# Patient Record
Sex: Female | Born: 1985 | Race: Black or African American | Hispanic: No | Marital: Single | Smoking: Current every day smoker
Health system: Southern US, Community
[De-identification: ages and names within clinical notes are randomized; demographics above are authoritative.]

## PROBLEM LIST (undated history)

## (undated) DIAGNOSIS — G709 Myoneural disorder, unspecified: Secondary | ICD-10-CM

## (undated) DIAGNOSIS — F419 Anxiety disorder, unspecified: Secondary | ICD-10-CM

## (undated) DIAGNOSIS — F1920 Other psychoactive substance dependence, uncomplicated: Secondary | ICD-10-CM

## (undated) DIAGNOSIS — T7840XA Allergy, unspecified, initial encounter: Secondary | ICD-10-CM

## (undated) DIAGNOSIS — M329 Systemic lupus erythematosus, unspecified: Secondary | ICD-10-CM

## (undated) DIAGNOSIS — M1990 Unspecified osteoarthritis, unspecified site: Secondary | ICD-10-CM

## (undated) DIAGNOSIS — F32A Depression, unspecified: Secondary | ICD-10-CM

## (undated) DIAGNOSIS — K219 Gastro-esophageal reflux disease without esophagitis: Secondary | ICD-10-CM

## (undated) HISTORY — DX: Allergy, unspecified, initial encounter: T78.40XA

## (undated) HISTORY — DX: Unspecified osteoarthritis, unspecified site: M19.90

## (undated) HISTORY — PX: ABDOMINAL HYSTERECTOMY: SHX81

## (undated) HISTORY — DX: Myoneural disorder, unspecified: G70.9

## (undated) HISTORY — DX: Systemic lupus erythematosus, unspecified: M32.9

## (undated) HISTORY — DX: Gastro-esophageal reflux disease without esophagitis: K21.9

---

## 2007-09-01 HISTORY — PX: LIPOMA EXCISION: SHX5283

## 2011-02-14 ENCOUNTER — Inpatient Hospital Stay (INDEPENDENT_AMBULATORY_CARE_PROVIDER_SITE_OTHER)
Admission: RE | Admit: 2011-02-14 | Discharge: 2011-02-14 | Disposition: A | Discharge status: Home / Self Care | Payer: Self-pay | Source: Ambulatory Visit | Attending: Emergency Medicine | Admitting: Emergency Medicine

## 2011-02-14 DIAGNOSIS — N390 Urinary tract infection, site not specified: Secondary | ICD-10-CM

## 2011-02-14 LAB — POCT URINALYSIS DIP (DEVICE)
Bilirubin Urine: NEGATIVE
Ketones, ur: NEGATIVE mg/dL
Protein, ur: 30 mg/dL — AB
Specific Gravity, Urine: 1.02 (ref 1.005–1.030)
pH: 7.5 (ref 5.0–8.0)

## 2011-02-14 LAB — POCT PREGNANCY, URINE: Preg Test, Ur: NEGATIVE

## 2011-02-24 ENCOUNTER — Inpatient Hospital Stay (INDEPENDENT_AMBULATORY_CARE_PROVIDER_SITE_OTHER)
Admission: RE | Admit: 2011-02-24 | Discharge: 2011-02-24 | Disposition: A | Discharge status: Home / Self Care | Payer: Self-pay | Source: Ambulatory Visit | Attending: Emergency Medicine | Admitting: Emergency Medicine

## 2011-02-24 DIAGNOSIS — N946 Dysmenorrhea, unspecified: Secondary | ICD-10-CM

## 2011-02-25 LAB — POCT URINALYSIS DIP (DEVICE)
Glucose, UA: NEGATIVE mg/dL
Ketones, ur: 15 mg/dL — AB
Nitrite: POSITIVE — AB
pH: 6 (ref 5.0–8.0)

## 2011-03-11 ENCOUNTER — Emergency Department (HOSPITAL_COMMUNITY)
Admission: EM | Admit: 2011-03-11 | Discharge: 2011-03-11 | Disposition: A | Discharge status: Home / Self Care | Payer: Self-pay | Attending: Emergency Medicine | Admitting: Emergency Medicine

## 2011-03-11 DIAGNOSIS — M549 Dorsalgia, unspecified: Secondary | ICD-10-CM | POA: Insufficient documentation

## 2011-03-11 DIAGNOSIS — A599 Trichomoniasis, unspecified: Secondary | ICD-10-CM | POA: Insufficient documentation

## 2011-03-11 LAB — URINALYSIS, ROUTINE W REFLEX MICROSCOPIC
Nitrite: NEGATIVE
Protein, ur: NEGATIVE mg/dL
Specific Gravity, Urine: 1.027 (ref 1.005–1.030)
Urobilinogen, UA: 0.2 mg/dL (ref 0.0–1.0)

## 2011-03-11 LAB — URINE MICROSCOPIC-ADD ON

## 2012-02-26 ENCOUNTER — Encounter (HOSPITAL_COMMUNITY): Payer: Self-pay | Admitting: Emergency Medicine

## 2012-02-26 ENCOUNTER — Emergency Department (HOSPITAL_COMMUNITY)
Admission: EM | Admit: 2012-02-26 | Discharge: 2012-02-27 | Disposition: A | Discharge status: Home / Self Care | Payer: Self-pay | Attending: Emergency Medicine | Admitting: Emergency Medicine

## 2012-02-26 DIAGNOSIS — F1910 Other psychoactive substance abuse, uncomplicated: Secondary | ICD-10-CM

## 2012-02-26 DIAGNOSIS — F1120 Opioid dependence, uncomplicated: Secondary | ICD-10-CM | POA: Insufficient documentation

## 2012-02-26 DIAGNOSIS — F17200 Nicotine dependence, unspecified, uncomplicated: Secondary | ICD-10-CM | POA: Insufficient documentation

## 2012-02-26 HISTORY — DX: Other psychoactive substance dependence, uncomplicated: F19.20

## 2012-02-26 LAB — CBC
HCT: 40.4 % (ref 36.0–46.0)
MCH: 31.2 pg (ref 26.0–34.0)
MCHC: 35.9 g/dL (ref 30.0–36.0)
MCV: 86.9 fL (ref 78.0–100.0)
Platelets: 229 10*3/uL (ref 150–400)
RDW: 12.6 % (ref 11.5–15.5)
WBC: 11.9 10*3/uL — ABNORMAL HIGH (ref 4.0–10.5)

## 2012-02-26 LAB — RAPID URINE DRUG SCREEN, HOSP PERFORMED
Amphetamines: NOT DETECTED
Cocaine: NOT DETECTED
Opiates: NOT DETECTED

## 2012-02-26 LAB — COMPREHENSIVE METABOLIC PANEL
AST: 22 U/L (ref 0–37)
Albumin: 3.7 g/dL (ref 3.5–5.2)
BUN: 10 mg/dL (ref 6–23)
Calcium: 9.5 mg/dL (ref 8.4–10.5)
Chloride: 100 mEq/L (ref 96–112)
Creatinine, Ser: 1.06 mg/dL (ref 0.50–1.10)
Total Bilirubin: 0.3 mg/dL (ref 0.3–1.2)

## 2012-02-26 LAB — ETHANOL: Alcohol, Ethyl (B): <11 mg/dL (ref 0–11)

## 2012-02-26 MED ORDER — ZOLPIDEM TARTRATE 5 MG PO TABS
5.0000 mg | ORAL_TABLET | Freq: Every evening | ORAL | Status: DC | PRN
  Administered 2012-02-27: 5 mg via ORAL
  Filled 2012-02-26: qty 1

## 2012-02-26 MED ORDER — NICOTINE 21 MG/24HR TD PT24
21.0000 mg | MEDICATED_PATCH | Freq: Every day | TRANSDERMAL | Status: DC
  Administered 2012-02-26 – 2012-02-27 (×2): 21 mg via TRANSDERMAL
  Filled 2012-02-26 (×3): qty 1

## 2012-02-26 MED ORDER — ALUM & MAG HYDROXIDE-SIMETH 200-200-20 MG/5ML PO SUSP: 30.0000 mL | ORAL | Status: DC | PRN

## 2012-02-26 MED ORDER — LORAZEPAM 1 MG PO TABS
1.0000 mg | ORAL_TABLET | Freq: Once | ORAL | Status: AC
  Administered 2012-02-26: 1 mg via ORAL
  Filled 2012-02-26: qty 1

## 2012-02-26 MED ORDER — ONDANSETRON HCL 8 MG PO TABS: 4.0000 mg | ORAL_TABLET | Freq: Three times a day (TID) | ORAL | Status: DC | PRN

## 2012-02-26 MED ORDER — ACETAMINOPHEN 325 MG PO TABS
650.0000 mg | ORAL_TABLET | ORAL | Status: DC | PRN
  Administered 2012-02-26 – 2012-02-27 (×2): 650 mg via ORAL
  Filled 2012-02-26 (×2): qty 2

## 2012-02-26 MED ORDER — IBUPROFEN 400 MG PO TABS
600.0000 mg | ORAL_TABLET | Freq: Three times a day (TID) | ORAL | Status: DC | PRN
  Administered 2012-02-27 (×2): 600 mg via ORAL
  Filled 2012-02-26: qty 1
  Filled 2012-02-26: qty 3
  Filled 2012-02-26: qty 1

## 2012-02-26 NOTE — ED Provider Notes (Signed)
History     CSN: [id number]  Arrival date & time 02/26/12  1745   First MD Initiated Contact with Patient 02/26/12 1820      Chief Complaint  Patient presents with  . Medical Clearance    (Consider location/radiation/quality/duration/timing/severity/associated sxs/prior treatment) HPI Pt presents with request for detox from opiates.  Pt states her last opiate use was approx 2 weeks ago.  She states she has been looking into rehab and was directed to come to the ED for detox.  Denies any symptoms of withdrawal- no n/v/d, no body aches.  No recent fevers or other illness.  There are no other associated systemic symptoms, there are no other alleviating or modifying factors.  She denies SI/HI ideations or thoughts Past Medical History  Diagnosis Date  . Drug dependence     History reviewed. No pertinent past surgical history.  No family history on file.  History  Substance Use Topics  . Smoking status: Current Everyday Smoker  . Smokeless tobacco: Not on file  . Alcohol Use: Yes    OB History    Grav Para Term Preterm Abortions TAB SAB Ect Mult Living                  Review of Systems ROS reviewed and all otherwise negative except for mentioned in HPI  Allergies  Penicillins  Home Medications  No current outpatient prescriptions on file.  BP 135/75  Pulse 92  Temp 98.4 F (36.9 C)  Resp 20  SpO2 98%  LMP 02/13/2012 Vitals reviewed Physical Exam Physical Examination: General appearance - alert, well appearing, and in no distress Mental status - alert, oriented to person, place, and time Mouth - mucous membranes moist, pharynx normal without lesions Chest - clear to auscultation, no wheezes, rales or rhonchi, symmetric air entry Heart - normal rate, regular rhythm, normal S1, S2, no murmurs, rubs, clicks or gallops Abdomen - soft, nontender, nondistended, no masses or organomegaly Musculoskeletal - no joint tenderness, deformity or swelling Extremities -  peripheral pulses normal, no pedal edema, no clubbing or cyanosis Skin - normal coloration and turgor, no rashes Psych- normal mood and affect  ED Course  Procedures (including critical care time)  11:40 PM  Discussed with ACT team, she will evaluate in the ED, psych holding orders written  Labs Reviewed  CBC - Abnormal; Notable for the following:    WBC 11.9 (*)     All other components within normal limits  COMPREHENSIVE METABOLIC PANEL  ETHANOL  URINE RAPID DRUG SCREEN (HOSP PERFORMED)   No results found.   No diagnosis found.    MDM  Pt presenting with c/o request for detox from opiates. She is not experiencing any acute withdrawal symptoms.  She denies SI/HI.  ACT evaluation pending.  Psych holding orders written        Ethelda Chick, MD 02/27/12 2222

## 2012-02-26 NOTE — ED Notes (Signed)
The pt is c/o some leg pain.  Tylenol given

## 2012-02-26 NOTE — ED Notes (Signed)
Patient wanded and clothing and pocketbook locked in locker 11.

## 2012-02-26 NOTE — ED Notes (Signed)
Patient states she is here for detox from opiates and last uses x 1 week ago. Patient denies any pain, N/V/D/F at this time.

## 2012-02-26 NOTE — ED Notes (Signed)
Pt states she is here for detox from opiates.  Last used 1 week ago.  Denies suicidal/homicidal ideation.

## 2012-02-26 NOTE — ED Notes (Signed)
Requested urine specimen, patient unable to urinate at this time

## 2012-02-26 NOTE — ED Notes (Signed)
Patient taking a shower.

## 2012-02-27 MED ORDER — LORAZEPAM 1 MG PO TABS
1.0000 mg | ORAL_TABLET | Freq: Four times a day (QID) | ORAL | Status: DC | PRN
  Administered 2012-02-27: 1 mg via ORAL
  Filled 2012-02-27: qty 1

## 2012-02-27 MED ORDER — LORAZEPAM 1 MG PO TABS
1.0000 mg | ORAL_TABLET | Freq: Once | ORAL | Status: AC
  Administered 2012-02-27: 1 mg via ORAL
  Filled 2012-02-27: qty 1

## 2012-02-27 NOTE — ED Notes (Signed)
Voices no complaints at this time. Resting with lights out, arouses easily.

## 2012-02-27 NOTE — ED Notes (Signed)
Resting quietly  No further complaints of pain at this time.

## 2012-02-27 NOTE — ED Notes (Signed)
Resting quietly with eyes closed arouses easily, voice no C/O at this time.
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
## 2012-02-27 NOTE — ED Notes (Signed)
Resting quietly with eyes closed appears to be sleeping. NAD noted. 

## 2012-02-27 NOTE — ED Notes (Signed)
Patient up to the shower.

## 2012-02-27 NOTE — ED Notes (Signed)
Patient wanting to sleep today she did not eat her lunch tray. Ate 100% at breakfast. Denies pain or discomfort.

## 2012-02-27 NOTE — ED Notes (Signed)
The patient is AOx4 and comfortable with her discharge instructions. 

## 2012-02-27 NOTE — ED Notes (Signed)
Pt asking for another ativan.  Feeling nervous

## 2012-02-27 NOTE — ED Notes (Signed)
Pt up to the br after she was given food to eat

## 2012-02-27 NOTE — ED Notes (Signed)
Patient requested Ibuprofen for leg discomfort, although it was to early. So She then requested Tylenol 650mg s , this was given

## 2012-02-27 NOTE — Discharge Instructions (Signed)
You should follow up with rehab at the numbers provided by ACT team counselor in ED.    Return to the ED with any concerns including thoughts or feelings of suicide, or any other alarming symptoms

## 2012-02-27 NOTE — BHH Counselor (Signed)
CLINICIAN DID CALL [facility] & SPOKE WITH CHARGE NURSE CHRISTY WHO EXPRESSED THAT ANGIE WAS NOT IN ON WEEKENDS & PT WOULD NEED TO CALL ON Monday TO SPEAK WITH ANIE ABOUT GETTING INTO THE REHAB PROGRAM. PT STILL DOES NOT MEET CRITERIA FOR DETOX & WILL DISCHARGED HOME TO FOLLOW UP WITH ANGIE DURING THE WEEK.

## 2013-03-27 ENCOUNTER — Encounter (HOSPITAL_COMMUNITY): Payer: Self-pay | Admitting: Emergency Medicine

## 2013-03-27 DIAGNOSIS — F1920 Other psychoactive substance dependence, uncomplicated: Secondary | ICD-10-CM | POA: Insufficient documentation

## 2013-03-27 DIAGNOSIS — R609 Edema, unspecified: Secondary | ICD-10-CM | POA: Insufficient documentation

## 2013-03-27 DIAGNOSIS — R630 Anorexia: Secondary | ICD-10-CM | POA: Insufficient documentation

## 2013-03-27 DIAGNOSIS — F17200 Nicotine dependence, unspecified, uncomplicated: Secondary | ICD-10-CM | POA: Insufficient documentation

## 2013-03-27 DIAGNOSIS — Z3202 Encounter for pregnancy test, result negative: Secondary | ICD-10-CM | POA: Insufficient documentation

## 2013-03-27 DIAGNOSIS — Z880 Allergy status to penicillin: Secondary | ICD-10-CM | POA: Insufficient documentation

## 2013-03-27 DIAGNOSIS — Y9229 Other specified public building as the place of occurrence of the external cause: Secondary | ICD-10-CM | POA: Insufficient documentation

## 2013-03-27 DIAGNOSIS — Y9301 Activity, walking, marching and hiking: Secondary | ICD-10-CM | POA: Insufficient documentation

## 2013-03-27 DIAGNOSIS — N390 Urinary tract infection, site not specified: Secondary | ICD-10-CM | POA: Insufficient documentation

## 2013-03-27 DIAGNOSIS — W19XXXA Unspecified fall, initial encounter: Secondary | ICD-10-CM | POA: Insufficient documentation

## 2013-03-27 NOTE — ED Notes (Signed)
PT. FELL 2 DAYS AGO REPORTS MID CHEST PAIN AND RIGHT ANKLE PAIN , AMBULATORY . RESPIRATIONS UNLABORED .

## 2013-03-28 ENCOUNTER — Emergency Department (HOSPITAL_COMMUNITY)
Admit: 2013-03-28 | Discharge: 2013-03-28 | Disposition: A | Discharge status: Home / Self Care | Payer: Self-pay | Attending: Emergency Medicine | Admitting: Emergency Medicine

## 2013-03-28 ENCOUNTER — Emergency Department (HOSPITAL_COMMUNITY)
Admission: EM | Admit: 2013-03-28 | Discharge: 2013-03-28 | Disposition: A | Discharge status: Home / Self Care | Payer: Self-pay | Attending: Emergency Medicine | Admitting: Emergency Medicine

## 2013-03-28 DIAGNOSIS — N390 Urinary tract infection, site not specified: Secondary | ICD-10-CM

## 2013-03-28 DIAGNOSIS — R609 Edema, unspecified: Secondary | ICD-10-CM

## 2013-03-28 LAB — URINALYSIS, ROUTINE W REFLEX MICROSCOPIC
Nitrite: NEGATIVE
Protein, ur: NEGATIVE mg/dL
Specific Gravity, Urine: 1.024 (ref 1.005–1.030)
Urobilinogen, UA: 1 mg/dL (ref 0.0–1.0)

## 2013-03-28 LAB — CBC
MCH: 31.7 pg (ref 26.0–34.0)
MCV: 89.7 fL (ref 78.0–100.0)
Platelets: 193 10*3/uL (ref 150–400)
RBC: 4.38 MIL/uL (ref 3.87–5.11)
RDW: 12.8 % (ref 11.5–15.5)
WBC: 10.3 10*3/uL (ref 4.0–10.5)

## 2013-03-28 LAB — POCT PREGNANCY, URINE: Preg Test, Ur: NEGATIVE

## 2013-03-28 LAB — URINE MICROSCOPIC-ADD ON

## 2013-03-28 LAB — POCT I-STAT TROPONIN I

## 2013-03-28 MED ORDER — SULFAMETHOXAZOLE-TMP DS 800-160 MG PO TABS: 1.0000 | ORAL_TABLET | Freq: Two times a day (BID) | ORAL | Status: DC

## 2013-03-28 MED ORDER — SULFAMETHOXAZOLE-TMP DS 800-160 MG PO TABS
1.0000 | ORAL_TABLET | Freq: Once | ORAL | Status: AC
  Administered 2013-03-28: 1 via ORAL
  Filled 2013-03-28: qty 1

## 2013-03-28 NOTE — ED Notes (Signed)
Walking in flip-flops with fast steady gait, no limp. Alert, NAD, calm, interactive.

## 2013-03-28 NOTE — ED Notes (Signed)
Pt states that both right and left legs ache below knees; denies injury to either leg.  Pt stated that swelling began yesterday but there is reduction in swelling today/pain remains at 7 out of 10.

## 2013-03-28 NOTE — ED Provider Notes (Signed)
CSN: [id number]     Arrival date & time 03/27/13  2318 History     First MD Initiated Contact with Patient 03/28/13 0100     Chief Complaint  Patient presents with  . Fall  . Ankle Pain   (Consider location/radiation/quality/duration/timing/severity/associated sxs/prior Treatment) HPI Comments: Patient states, that 2 days ago, while walking to the grocery store from the shelter.  She "passed out."  She has no idea how long she was on the ground, but bystanders assisted her to her feet and allow her to sleep on her their couch for the day.  She denies any nausea, vomiting, fever, chills, diarrhea, URI symptoms, cough, UTI symptoms, stating her last menstrual cycle was 3, and a half weeks, ago.  Denies any injury to her fall, but she does report that her legs, below the knee have been swollen.  This has been resolving.  She, states she's been drinking plenty of fluids has a family history of high blood pressure, but no personal history of hypertension. So today she presents to find out why she passed out 2 days ago  Patient is a 27 y.o. female presenting with fall and ankle pain. The history is provided by the patient.  Fall This is a new problem. The current episode started in the past 7 days. The problem has been rapidly improving. Associated symptoms include anorexia. Pertinent negatives include no chest pain, chills, coughing, fever, headaches, nausea, rash or vomiting. Nothing aggravates the symptoms. She has tried nothing for the symptoms. The treatment provided no relief.  Ankle Pain Associated symptoms: no fever     Past Medical History  Diagnosis Date  . Drug dependence    History reviewed. No pertinent past surgical history. No family history on file. History  Substance Use Topics  . Smoking status: Current Every Day Smoker  . Smokeless tobacco: Not on file  . Alcohol Use: Yes   OB History   Grav Para Term Preterm Abortions TAB SAB Ect Mult Living                 Review  of Systems  Constitutional: Negative for fever and chills.  Respiratory: Negative for cough.   Cardiovascular: Positive for leg swelling. Negative for chest pain.  Gastrointestinal: Positive for anorexia. Negative for nausea and vomiting.  Skin: Negative for rash and wound.  Neurological: Negative for dizziness and headaches.  All other systems reviewed and are negative.    Allergies  Penicillins  Home Medications   Current Outpatient Rx  Name  Route  Sig  Dispense  Refill  . OVER THE COUNTER MEDICATION   Oral   Take 1-2 tablets by mouth every 6 (six) hours as needed (pain medication).         Marland Kitchen sulfamethoxazole-trimethoprim (BACTRIM DS) 800-160 MG per tablet   Oral   Take 1 tablet by mouth 2 (two) times daily.   5 tablet   0    BP 127/71  Pulse 74  Temp(Src) 98.3 F (36.8 C) (Oral)  Resp 16  Ht 5\' 9"  (1.753 m)  Wt 257 lb (116.574 kg)  BMI 37.93 kg/m2  SpO2 97%  LMP 02/27/2013 Physical Exam  Vitals reviewed. Constitutional: She appears well-developed and well-nourished.  Eyes: Pupils are equal, round, and reactive to light.  Cardiovascular: Normal rate and regular rhythm.   Pulmonary/Chest: Effort normal and breath sounds normal.  Musculoskeletal: She exhibits edema. She exhibits no tenderness.  Neurological: She is alert.  Skin: Skin is warm and dry.  No rash noted. No erythema.    ED Course   Procedures (including critical care time)  Labs Reviewed  URINALYSIS, ROUTINE W REFLEX MICROSCOPIC - Abnormal; Notable for the following:    APPearance CLOUDY (*)    Hgb urine dipstick LARGE (*)    Leukocytes, UA TRACE (*)    All other components within normal limits  URINE MICROSCOPIC-ADD ON - Abnormal; Notable for the following:    Squamous Epithelial / LPF FEW (*)    Bacteria, UA MANY (*)    All other components within normal limits  CBC  POCT PREGNANCY, URINE  POCT I-STAT TROPONIN I   Dg Ankle Complete Right  03/28/2013   *RADIOLOGY REPORT*  Clinical  Data: Swelling after syncopal episode yesterday.  RIGHT ANKLE - COMPLETE 3+ VIEW  Comparison: None.  Findings: Negative for fracture, malalignment, or joint narrowing.  IMPRESSION: Negative right ankle study.   Original Report Authenticated By: Tiburcio Pea   1. UTI (lower urinary tract infection)   2. Peripheral edema    ED ECG REPORT   Date: 03/28/2013  EKG Time: 2:25 AM  Rate: 79  Rhythm: normal sinus rhythm,  there are no previous tracings available for comparison  Axis: normal  Intervals:none  ST&T Change: none  Narrative Interpretation: normal           MDM  Will obtain CBC i-STAT urine, and EKG Will treat UTI recommend diet changes   Arman Filter, NP 03/28/13 1610

## 2013-03-29 NOTE — ED Provider Notes (Signed)
Medical screening examination/treatment/procedure(s) were performed by non-physician practitioner and as supervising physician I was immediately available for consultation/collaboration.  Tien Spooner, MD 03/29/13 0442 

## 2016-07-08 ENCOUNTER — Ambulatory Visit
Admission: RE | Admit: 2016-07-08 | Discharge: 2016-07-08 | Disposition: A | Discharge status: Home / Self Care | Payer: No Typology Code available for payment source | Source: Ambulatory Visit | Attending: Internal Medicine | Admitting: Internal Medicine

## 2016-07-08 ENCOUNTER — Other Ambulatory Visit: Payer: Self-pay | Admitting: Internal Medicine

## 2016-07-08 DIAGNOSIS — M549 Dorsalgia, unspecified: Secondary | ICD-10-CM

## 2017-04-03 IMAGING — CR DG LUMBAR SPINE COMPLETE 4+V
5 series · 5 of 5 positions shown · non-contrast
Comparison: None.

CLINICAL DATA: Chronic low back pain for 1 year with radiation to
right leg.

EXAM:
LUMBAR SPINE - COMPLETE 4+ VIEW

[t l-spine a.p.]
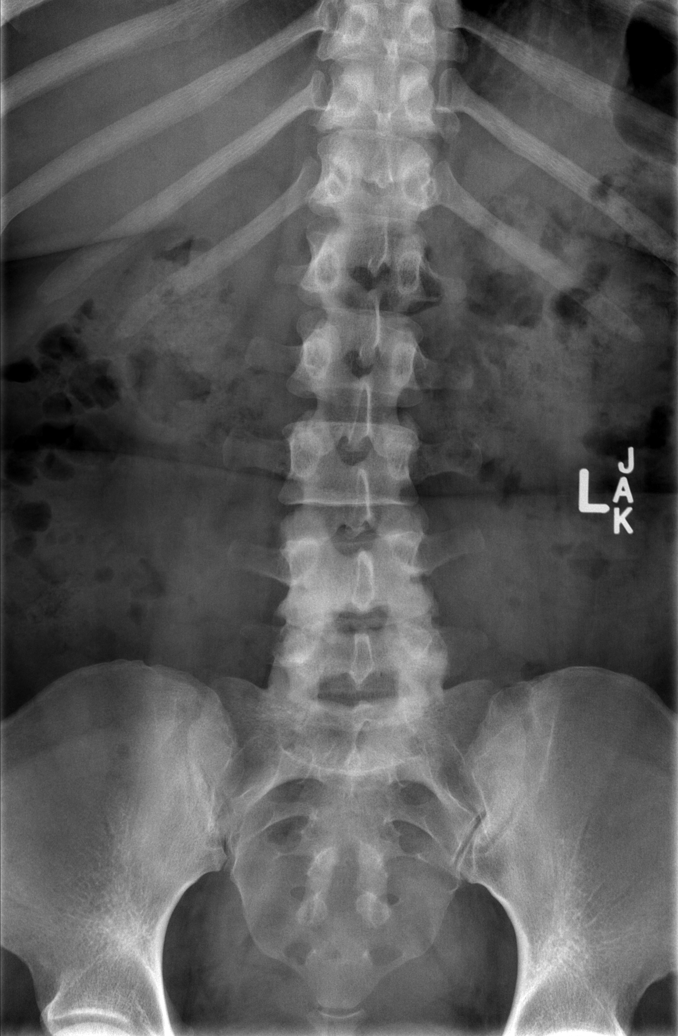

[t l-spine oblique exposure (1 of 2)]
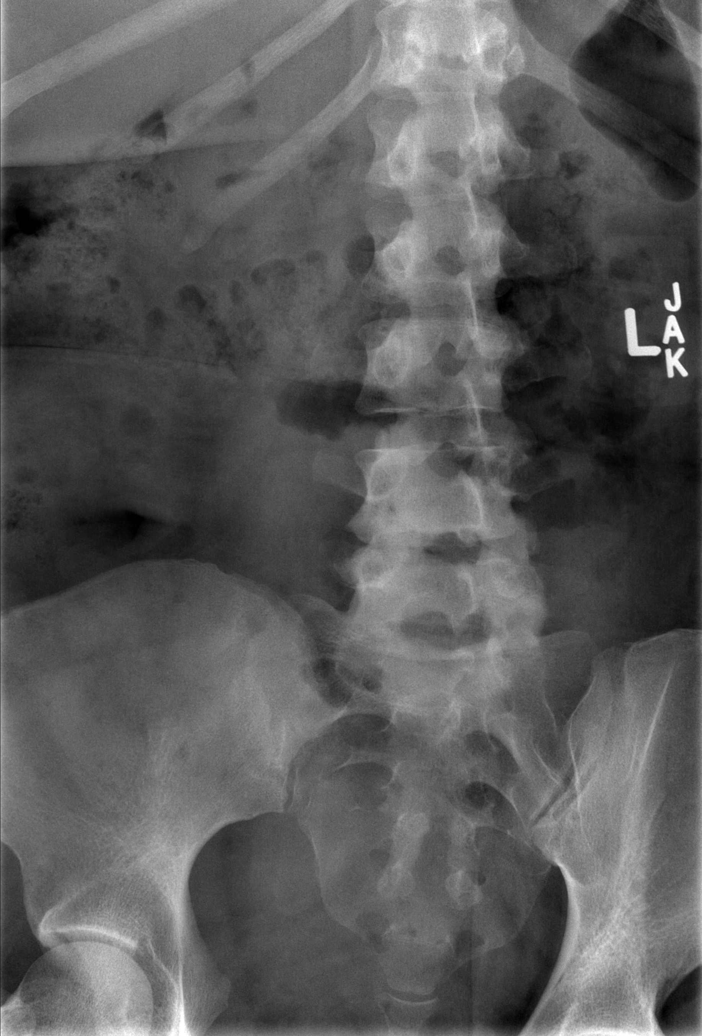

[t l-spine oblique exposure (2 of 2)]
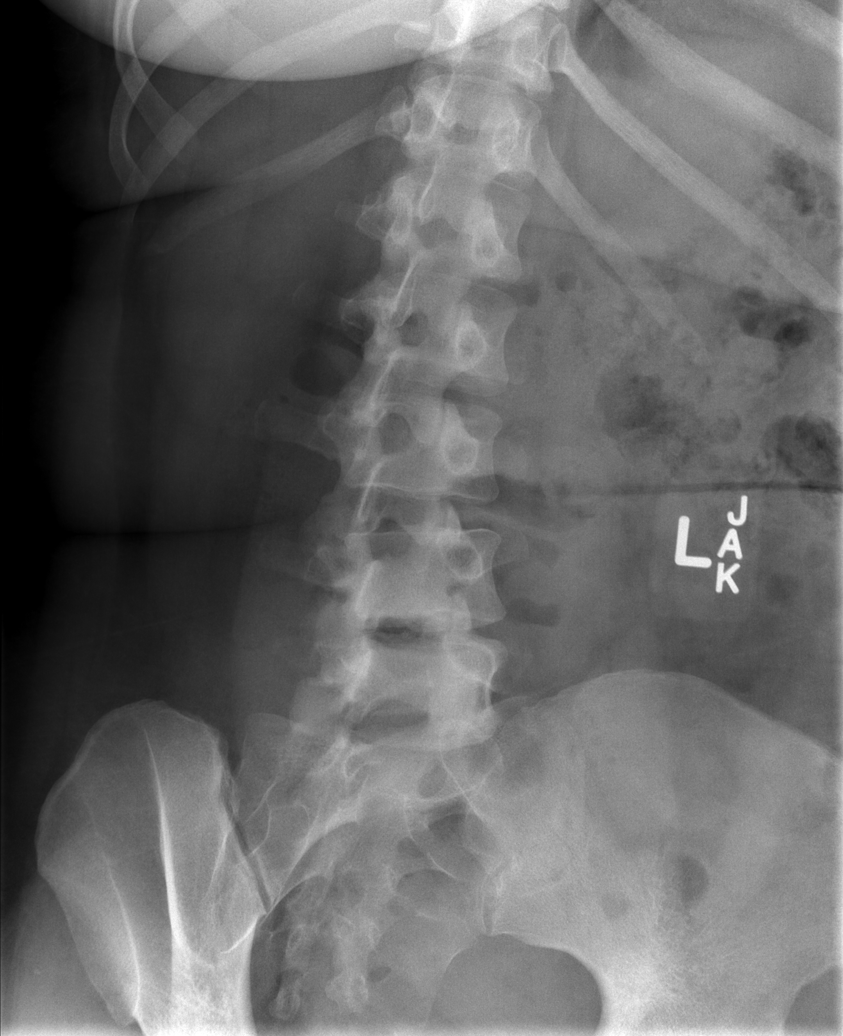

[t l-spine lat]
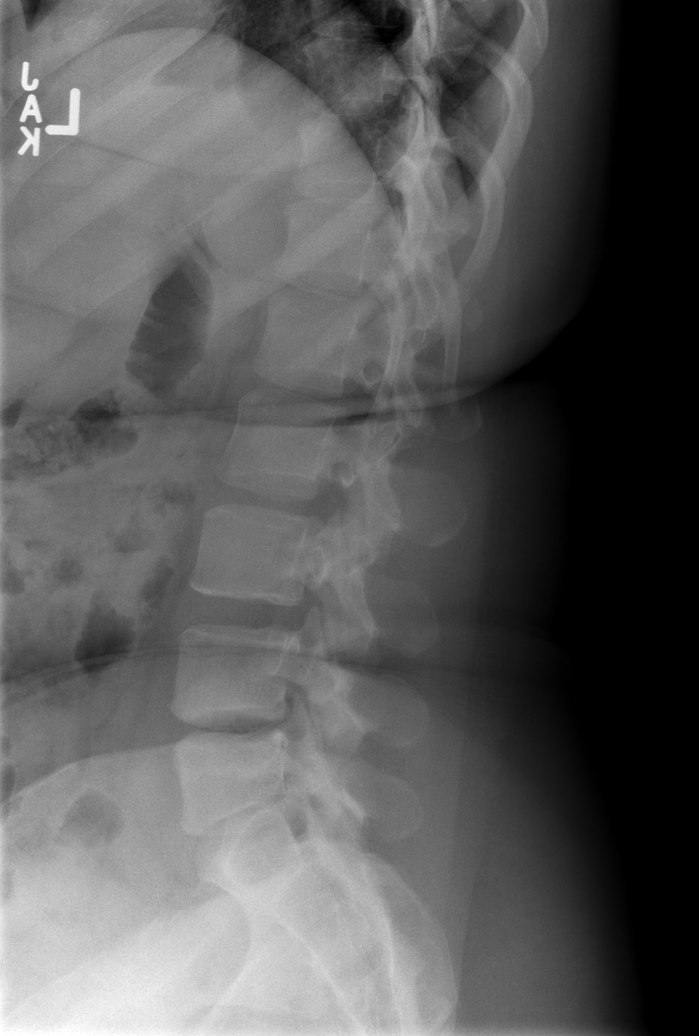

[t l-spine l5-s1 spot]
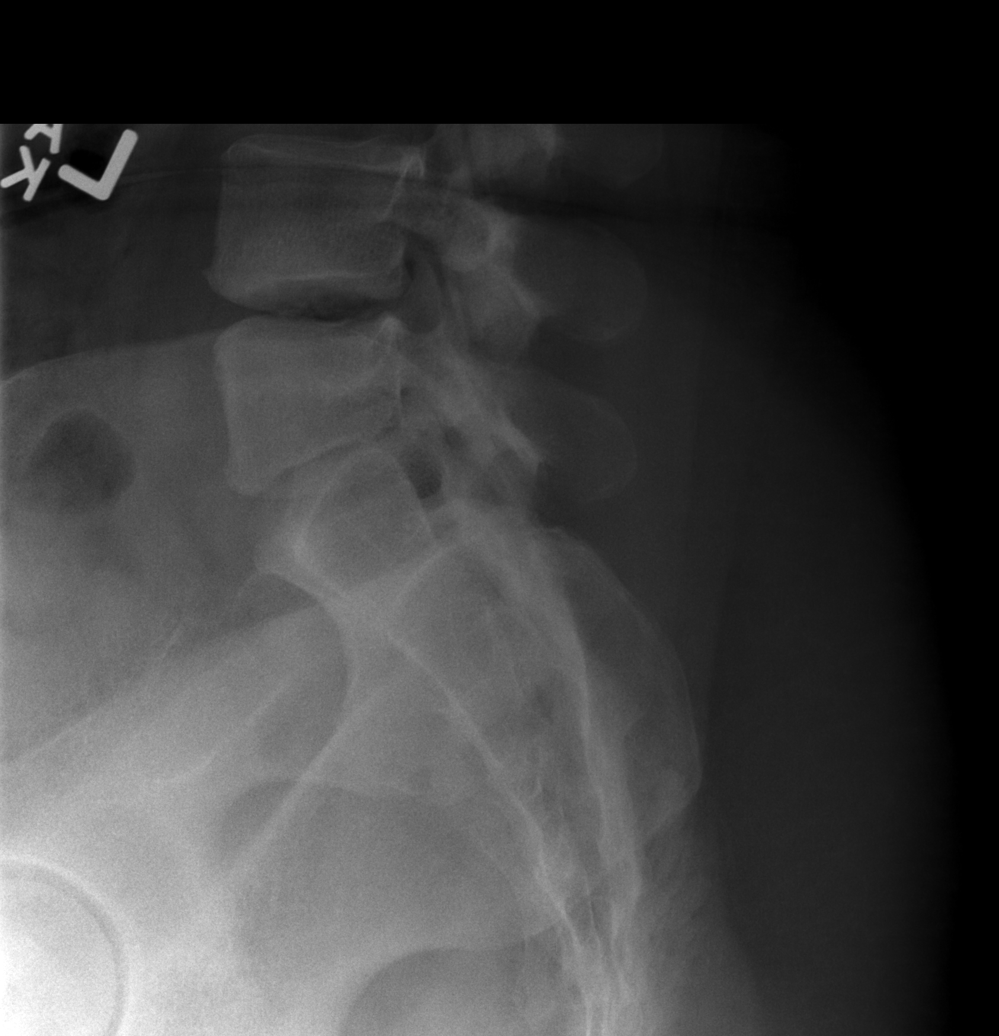

[5 of 5 positions shown; findings below may reference images not displayed]

FINDINGS: There is no evidence of lumbar spine fracture. Alignment is normal.
Mild degenerative disc disease seen at L4-5. Other intervertebral
disc spaces are maintained. No other bone lesions identified.
IMPRESSION: No acute findings.  Mild L4-5 degenerative disc disease.

## 2018-08-08 ENCOUNTER — Encounter (HOSPITAL_COMMUNITY): Payer: Self-pay | Admitting: Emergency Medicine

## 2018-08-08 ENCOUNTER — Ambulatory Visit (HOSPITAL_COMMUNITY)
Admission: EM | Admit: 2018-08-08 | Discharge: 2018-08-08 | Disposition: A | Discharge status: Home / Self Care | Payer: Self-pay | Attending: Family Medicine | Admitting: Family Medicine

## 2018-08-08 DIAGNOSIS — K047 Periapical abscess without sinus: Secondary | ICD-10-CM

## 2018-08-08 DIAGNOSIS — K0889 Other specified disorders of teeth and supporting structures: Secondary | ICD-10-CM

## 2018-08-08 MED ORDER — HYDROCODONE-ACETAMINOPHEN 5-325 MG PO TABS: 1.0000 | ORAL_TABLET | Freq: Every day | ORAL | 0 refills | Status: DC

## 2018-08-08 MED ORDER — CLINDAMYCIN HCL 300 MG PO CAPS: 300.0000 mg | ORAL_CAPSULE | Freq: Three times a day (TID) | ORAL | 0 refills | Status: AC

## 2018-08-08 NOTE — Discharge Instructions (Signed)
Use OTC ibuprofen and/or naproxen during the day for pain Norco for severe break-through pain at night Clindamycin prescribed for potential dental infection.  Take as directed and to completion.   Recommend soft diet until evaluated by dentist Maintain oral hygiene care Follow up with dentist as soon as possible for further evaluation and treatment  Return or go to the ED if you have any new or worsening symptoms such as fever, chills, difficulty swallowing, painful swallowing, oral or neck swelling, nausea, vomiting, chest pain, SOB, etc..Marland Kitchen

## 2018-08-08 NOTE — ED Triage Notes (Signed)
Pt did not answer x 2 

## 2018-08-08 NOTE — ED Provider Notes (Signed)
[place]   [id number] 08/08/18 Arrival Time: 1057  CC: DENTAL PAIN  SUBJECTIVE:  Megan [name] is a 32 y.o. female who presents with right-sided dental pain x 2 weeks. Denies a precipitating event or trauma.  Localizes pain to right lower jaw.  Has tried OTC analgesics without relief.  Worse with chewing.  Does not see a dentist regularly.  Reports mild RT sided swelling.  Denies fever, chills, dysphagia, odynophagia, neck swelling, nausea, vomiting, chest pain, SOB.    ROS: As per HPI.  Past Medical History:  Diagnosis Date  . Drug dependence ([employer])    History reviewed. No pertinent surgical history. Allergies  Allergen Reactions  . Penicillins Other (See Comments)    Unknown since childhood    No current facility-administered medications on file prior to encounter.    Current Outpatient Medications on File Prior to Encounter  Medication Sig Dispense Refill  . OVER THE COUNTER MEDICATION Take 1-2 tablets by mouth every 6 (six) hours as needed (pain medication).     Social History   Socioeconomic History  . Marital status: Single    Spouse name: Not on file  . Number of children: Not on file  . Years of education: Not on file  . Highest education level: Not on file  Occupational History  . Not on file  Social Needs  . Financial resource strain: Not on file  . Food insecurity:    Worry: Not on file    Inability: Not on file  . Transportation needs:    Medical: Not on file    Non-medical: Not on file  Tobacco Use  . Smoking status: Current Every Day Smoker  Substance and Sexual Activity  . Alcohol use: Yes  . Drug use: Yes    Types: Marijuana  . Sexual activity: Not on file  Lifestyle  . Physical activity:    Days per week: Not on file    Minutes per session: Not on file  . Stress: Not on file  Relationships  . Social connections:    Talks on phone: Not on file    Gets together: Not on file    Attends religious service: Not on file   Active member of club or organization: Not on file    Attends meetings of clubs or organizations: Not on file    Relationship status: Not on file  . Intimate partner violence:    Fear of current or ex partner: Not on file    Emotionally abused: Not on file    Physically abused: Not on file    Forced sexual activity: Not on file  Other Topics Concern  . Not on file  Social History Narrative  . Not on file   History reviewed. No pertinent family history.  OBJECTIVE:  Vitals:   08/08/18 1232  BP: (!) 141/80  Pulse: 88  Resp: 18  Temp: 98.4 F (36.9 C)  TempSrc: Oral  SpO2: 100%    General appearance: alert; appears mildly uncomfortable HENT: normocephalic; atraumatic; dentition: fair; dental caries over right lower gums without areas of fluctuance; tender over RT lower gum line Neck: supple without LAD CV: RRR Lungs: normal respirations Skin: warm and dry Psychological: alert and cooperative; normal mood and affect  ASSESSMENT & PLAN:  1. Pain, dental   2. Dental infection     Meds ordered this encounter  Medications  . clindamycin (CLEOCIN) 300 MG capsule    Sig: Take 1 capsule (300 mg total) by mouth 3 (three)  times daily for 10 days.    Dispense:  30 capsule    Refill:  0    Order Specific Question:   Supervising Provider    Answer:   Raylene Everts [[id number]]  . HYDROcodone-acetaminophen (NORCO/VICODIN) 5-325 MG tablet    Sig: Take 1 tablet by mouth at bedtime.    Dispense:  10 tablet    Refill:  0    Order Specific Question:   Supervising Provider    Answer:   Raylene Everts [[id number]]    Use OTC ibuprofen and/or naproxen during the day for pain Norco for severe break-through pain at night Clindamycin prescribed for potential dental infection.  Take as directed and to completion.   Recommend soft diet until evaluated by dentist Maintain oral hygiene care Follow up with dentist as soon as possible for further evaluation and treatment  Return or go  to the ED if you have any new or worsening symptoms such as fever, chills, difficulty swallowing, painful swallowing, oral or neck swelling, nausea, vomiting, chest pain, SOB, etc...  Reviewed expectations re: course of current medical issues. Questions answered. Outlined signs and symptoms indicating need for more acute intervention. Patient verbalized understanding. After Visit Summary given.   Lestine Box, PA-C 08/08/18 [phone]

## 2018-08-08 NOTE — ED Triage Notes (Signed)
Pt here for right sided dental pain 

## 2019-06-09 ENCOUNTER — Other Ambulatory Visit: Payer: Self-pay

## 2019-06-09 DIAGNOSIS — Z20822 Contact with and (suspected) exposure to covid-19: Secondary | ICD-10-CM

## 2019-06-11 LAB — NOVEL CORONAVIRUS, NAA: SARS-CoV-2, NAA: NOT DETECTED

## 2019-06-29 ENCOUNTER — Ambulatory Visit (HOSPITAL_COMMUNITY)
Admission: EM | Admit: 2019-06-29 | Discharge: 2019-06-29 | Disposition: A | Discharge status: Home / Self Care | Payer: Self-pay | Attending: Family Medicine | Admitting: Family Medicine

## 2019-06-29 ENCOUNTER — Other Ambulatory Visit: Payer: Self-pay

## 2019-06-29 ENCOUNTER — Encounter (HOSPITAL_COMMUNITY): Payer: Self-pay

## 2019-06-29 DIAGNOSIS — K029 Dental caries, unspecified: Secondary | ICD-10-CM

## 2019-06-29 MED ORDER — AMOXICILLIN-POT CLAVULANATE 875-125 MG PO TABS: 1.0000 | ORAL_TABLET | Freq: Two times a day (BID) | ORAL | 0 refills | Status: DC

## 2019-06-29 MED ORDER — HYDROCODONE-ACETAMINOPHEN 5-325 MG PO TABS: 1.0000 | ORAL_TABLET | Freq: Four times a day (QID) | ORAL | 0 refills | Status: DC | PRN

## 2019-06-29 NOTE — Discharge Instructions (Addendum)
Treating you for a dental infection Take the medication as prescribed For any worsening symptoms you will need to follow up or go to the ER.

## 2019-06-29 NOTE — ED Triage Notes (Signed)
Pt presents with left side dental pain and swelling since waking up this morning.

## 2019-06-30 NOTE — ED Provider Notes (Signed)
[place]    CSN: [id number] Arrival date & time: 06/29/19  1457      History   Chief Complaint Chief Complaint  Patient presents with  . Dental Pain  . Oral Swelling    HPI Megan [name] is a 33 y.o. female.   Patient is a 33 year old female that presents today with left lower dental pain, facial swelling.  Symptoms been constant and worsening over the past day.  Woke up with worsening swelling this morning.  History of dental infections in the past.  Currently does not have a dentist.  Has been taking Tylenol and ibuprofen without any relief.  Denies any associated fever, chills, body aches, trismus, drooling.  Denies any drainage from the tooth.  ROS per HPI      Past Medical History:  Diagnosis Date  . Drug dependence ([place])     There are no active problems to display for this patient.   History reviewed. No pertinent surgical history.  OB History   No obstetric history on file.      Home Medications    Prior to Admission medications   Medication Sig Start Date End Date Taking? Authorizing Provider  amoxicillin-clavulanate (AUGMENTIN) 875-125 MG tablet Take 1 tablet by mouth every 12 (twelve) hours. 06/29/19   Loura Halt A, NP  HYDROcodone-acetaminophen (NORCO/VICODIN) 5-325 MG tablet Take 1-2 tablets by mouth every 6 (six) hours as needed for moderate pain. 06/29/19   Loura Halt A, NP  OVER THE COUNTER MEDICATION Take 1-2 tablets by mouth every 6 (six) hours as needed (pain medication).    [provider]    Family History Family History  Family history unknown: Yes    Social History Social History   Tobacco Use  . Smoking status: Current Every Day Smoker  Substance Use Topics  . Alcohol use: Yes  . Drug use: Yes    Types: Marijuana     Allergies   Penicillins   Review of Systems Review of Systems   Physical Exam Triage Vital Signs ED Triage Vitals  Enc Vitals Group     BP 06/29/19 1519 123/86     Pulse  Rate 06/29/19 1519 82     Resp 06/29/19 1519 18     Temp 06/29/19 1519 98.2 F (36.8 C)     Temp Source 06/29/19 1519 Temporal     SpO2 06/29/19 1519 99 %     Weight --      Height --      Head Circumference --      Peak Flow --      Pain Score 06/29/19 1524 8     Pain Loc --      Pain Edu? --      Excl. in [place]? --    No data found.  Updated Vital Signs BP 123/86 (BP Location: Right Arm)   Pulse 82   Temp 98.2 F (36.8 C) (Temporal)   Resp 18   LMP 06/23/2019   SpO2 99%   Visual Acuity Right Eye Distance:   Left Eye Distance:   Bilateral Distance:    Right Eye Near:   Left Eye Near:    Bilateral Near:     Physical Exam Vitals signs and nursing note reviewed.  Constitutional:      General: She is not in acute distress.    Appearance: Normal appearance. She is not ill-appearing, toxic-appearing or diaphoretic.     Comments: Appears in pain   HENT:  Head: Normocephalic.     Nose: Nose normal.     Mouth/Throat:     Pharynx: Oropharynx is clear.      Comments: Multiple dental caries in mouth. Tender to palpation of left lower molar with gingival redness and swelling. Presignificant left lower facial swelling No trismus  Eyes:     Conjunctiva/sclera: Conjunctivae normal.  Neck:     Musculoskeletal: Normal range of motion.  Pulmonary:     Effort: Pulmonary effort is normal.  Musculoskeletal: Normal range of motion.  Skin:    General: Skin is warm and dry.     Findings: No rash.  Neurological:     Mental Status: She is alert.  Psychiatric:        Mood and Affect: Mood normal.      UC Treatments / Results  Labs (all labs ordered are listed, but only abnormal results are displayed) Labs Reviewed - No data to display  EKG   Radiology No results found.  Procedures Procedures (including critical care time)  Medications Ordered in UC Medications - No data to display  Initial Impression / Assessment and Plan / UC Course  I have reviewed the  triage vital signs and the nursing notes.  Pertinent labs & imaging results that were available during my care of the patient were reviewed by me and considered in my medical decision making (see chart for details).     Dental pain/infection-treating with Augmentin and hydrocodone for pain Dental resources given ER precautions given Final Clinical Impressions(s) / UC Diagnoses   Final diagnoses:  None     Discharge Instructions     Treating you for a dental infection Take the medication as prescribed For any worsening symptoms you will need to follow up or go to the ER.     ED Prescriptions    Medication Sig Dispense Auth. Provider   HYDROcodone-acetaminophen (NORCO/VICODIN) 5-325 MG tablet Take 1-2 tablets by mouth every 6 (six) hours as needed for moderate pain. 12 tablet Kynadi Dragos A, NP   amoxicillin-clavulanate (AUGMENTIN) 875-125 MG tablet Take 1 tablet by mouth every 12 (twelve) hours. 14 tablet Conswella Bruney A, NP     I have reviewed the PDMP during this encounter.   Orvan July, NP 06/30/19 1114

## 2021-01-13 ENCOUNTER — Encounter (HOSPITAL_COMMUNITY): Payer: Self-pay

## 2021-01-13 ENCOUNTER — Other Ambulatory Visit: Payer: Self-pay

## 2021-01-13 ENCOUNTER — Ambulatory Visit (HOSPITAL_COMMUNITY)
Admission: EM | Admit: 2021-01-13 | Discharge: 2021-01-13 | Disposition: A | Discharge status: Home / Self Care | Payer: Self-pay | Attending: Physician Assistant | Admitting: Physician Assistant

## 2021-01-13 DIAGNOSIS — K047 Periapical abscess without sinus: Secondary | ICD-10-CM

## 2021-01-13 DIAGNOSIS — K0889 Other specified disorders of teeth and supporting structures: Secondary | ICD-10-CM

## 2021-01-13 MED ORDER — HYDROCODONE-ACETAMINOPHEN 5-325 MG PO TABS: 1.0000 | ORAL_TABLET | Freq: Three times a day (TID) | ORAL | 0 refills | Status: DC | PRN

## 2021-01-13 MED ORDER — CLINDAMYCIN HCL 300 MG PO CAPS: 300.0000 mg | ORAL_CAPSULE | Freq: Three times a day (TID) | ORAL | 0 refills | Status: DC

## 2021-01-13 NOTE — Discharge Instructions (Addendum)
Take hydrocodone every 8 hours as needed.  You should not drive or drink alcohol with this medication as drowsiness is a common side effect.  This has Tylenol so you should use ibuprofen over-the-counter and avoid additional Tylenol.  Take antibiotic 3 times a day.  This can cause an upset stomach and if you have significant diarrhea you should stop the medication and return for reevaluation.  It is important that you follow-up with dentist as we discussed particular symptoms persist.

## 2021-01-13 NOTE — ED Provider Notes (Addendum)
[place]    CSN: [id number] Arrival date & time: 01/13/21  1122      History   Chief Complaint Chief Complaint  Patient presents with  . Dental Pain    HPI Megan [name] is a 35 y.o. female.   Patient presents today with a several day history of worsening dentalgia.  Reports pain is rated 7 on a 0-10 pain scale, localized to left lower molar without radiation, described as throbbing, worse with mastication.  She is drinking normally and eating soft foods without difficulty.  Denies changes to voice or shortness of breath.  She has not seen a dentist recently denies any recent dental procedures.  Denies any recent antibiotic use.  She has a cracked tooth in this area that has been infected in the past and states current symptoms are similar to previous episodes of this condition.  She does not currently have a dentist and is hoping that her dental insurance will kick in on her job she will be able to have this removed.  She has tried to remove this tooth on her own unsuccessfully.  She has continued to go to work despite symptoms.  She has no concern for pregnancy.  She has been alternating Tylenol and ibuprofen without improvement of symptoms.     Past Medical History:  Diagnosis Date  . Drug dependence ([place])     There are no problems to display for this patient.   History reviewed. No pertinent surgical history.  OB History   No obstetric history on file.      Home Medications    Prior to Admission medications   Medication Sig Start Date End Date Taking? Authorizing Provider  clindamycin (CLEOCIN) 300 MG capsule Take 1 capsule (300 mg total) by mouth 3 (three) times daily. 01/13/21  Yes Ayaan Ringle, Derry Skill, PA-C  HYDROcodone-acetaminophen (NORCO/VICODIN) 5-325 MG tablet Take 1 tablet by mouth every 8 (eight) hours as needed for severe pain. 01/13/21   Waynetta Metheny, Derry Skill, PA-C  OVER THE COUNTER MEDICATION Take 1-2 tablets by mouth every 6 (six) hours as needed  (pain medication).    [provider]    Family History Family History  Family history unknown: Yes    Social History Social History   Tobacco Use  . Smoking status: Current Every Day Smoker  . Smokeless tobacco: Never Used  Substance Use Topics  . Alcohol use: Yes  . Drug use: Yes    Types: Marijuana     Allergies   Penicillins   Review of Systems Review of Systems  Constitutional: Negative for activity change, appetite change, fatigue and fever.  HENT: Positive for dental problem. Negative for congestion, sinus pressure, sneezing and sore throat.   Respiratory: Negative for cough and shortness of breath.   Cardiovascular: Negative for chest pain.  Gastrointestinal: Positive for nausea. Negative for abdominal pain, diarrhea and vomiting.  Neurological: Positive for headaches. Negative for dizziness and light-headedness.     Physical Exam Triage Vital Signs ED Triage Vitals  Enc Vitals Group     BP 01/13/21 1309 112/74     Pulse Rate 01/13/21 1308 71     Resp 01/13/21 1308 20     Temp 01/13/21 1308 98.6 F (37 C)     Temp Source 01/13/21 1308 Oral     SpO2 01/13/21 1308 100 %     Weight --      Height --      Head Circumference --  Peak Flow --      Pain Score 01/13/21 1306 7     Pain Loc --      Pain Edu? --      Excl. in [place]? --    No data found.  Updated Vital Signs BP 112/74   Pulse 71   Temp 98.6 F (37 C) (Oral)   Resp 20   LMP 12/18/2020 (Approximate)   SpO2 100%   Visual Acuity Right Eye Distance:   Left Eye Distance:   Bilateral Distance:    Right Eye Near:   Left Eye Near:    Bilateral Near:     Physical Exam Vitals reviewed.  Constitutional:      General: She is awake. She is not in acute distress.    Appearance: Normal appearance. She is not ill-appearing.     Comments: Very pleasant female appears stated age in no acute distress  HENT:     Head: Normocephalic and atraumatic.     Nose: Nose normal.      Mouth/Throat:     Dentition: Abnormal dentition. Dental tenderness, gingival swelling and dental abscesses present.     Pharynx: Uvula midline. No oropharyngeal exudate or posterior oropharyngeal erythema.      Comments: Cracked tooth noted left lower molar with surrounding erythema.  No evidence of Ludwig angina. Cardiovascular:     Rate and Rhythm: Normal rate and regular rhythm.     Heart sounds: No murmur heard.   Pulmonary:     Effort: Pulmonary effort is normal.     Breath sounds: Normal breath sounds. No stridor. No wheezing, rhonchi or rales.     Comments: Clear to auscultation bilaterally Psychiatric:        Behavior: Behavior is cooperative.      UC Treatments / Results  Labs (all labs ordered are listed, but only abnormal results are displayed) Labs Reviewed - No data to display  EKG   Radiology No results found.  Procedures Procedures (including critical care time)  Medications Ordered in UC Medications - No data to display  Initial Impression / Assessment and Plan / UC Course  I have reviewed the triage vital signs and the nursing notes.  Pertinent labs & imaging results that were available during my care of the patient were reviewed by me and considered in my medical decision making (see chart for details).     Patient started on clindamycin given history of penicillin allergy.  Discussed that if she has any abdominal pain or significant diarrhea she is to stop the medication and seek immediate medical attention.  She was given 6 doses of hydrocodone to manage pain with instruction not to take additional Tylenol with this.  She was instructed not to drive or drink alcohol with this medication as drowsiness is a common side effect.  She can alternate with ibuprofen for additional symptom relief.  She was given contact information for dentist as tooth will likely need to be removed to prevent recurrent symptoms.  She was directed to go to the emergency room  with any worsening pain, fever, difficulty swallowing, difficulty speaking, shortness of breath.  Strict return precautions given to which patient expressed understanding.  Final Clinical Impressions(s) / UC Diagnoses   Final diagnoses:  Dental infection  Dentalgia     Discharge Instructions     Take hydrocodone every 8 hours as needed.  You should not drive or drink alcohol with this medication as drowsiness is a common side effect.  This has  Tylenol so you should use ibuprofen over-the-counter and avoid additional Tylenol.  Take antibiotic 3 times a day.  This can cause an upset stomach and if you have significant diarrhea you should stop the medication and return for reevaluation.  It is important that you follow-up with dentist as we discussed particular symptoms persist.    ED Prescriptions    Medication Sig Dispense Auth. Provider   HYDROcodone-acetaminophen (NORCO/VICODIN) 5-325 MG tablet Take 1 tablet by mouth every 8 (eight) hours as needed for severe pain. 6 tablet Zalan Shidler K, PA-C   clindamycin (CLEOCIN) 300 MG capsule Take 1 capsule (300 mg total) by mouth 3 (three) times daily. 30 capsule Shanena Pellegrino K, PA-C     I have reviewed the PDMP during this encounter.   Terrilee Croak, PA-C 01/13/21 1356    Kieley Akter, Derry Skill, PA-C 01/13/21 1357

## 2021-01-13 NOTE — ED Triage Notes (Signed)
Pt in with left dental pain x 2 days  Pt has taken advil with no relief

## 2022-01-01 ENCOUNTER — Encounter: Payer: Self-pay | Admitting: Family Medicine

## 2022-01-01 ENCOUNTER — Ambulatory Visit (INDEPENDENT_AMBULATORY_CARE_PROVIDER_SITE_OTHER): Payer: 59 | Admitting: Family Medicine

## 2022-01-01 VITALS — BP 138/86 | HR 81 | Temp 98.2°F | Ht 68.0 in | Wt 288.6 lb

## 2022-01-01 DIAGNOSIS — R1903 Right lower quadrant abdominal swelling, mass and lump: Secondary | ICD-10-CM | POA: Insufficient documentation

## 2022-01-01 DIAGNOSIS — N921 Excessive and frequent menstruation with irregular cycle: Secondary | ICD-10-CM

## 2022-01-01 DIAGNOSIS — R1084 Generalized abdominal pain: Secondary | ICD-10-CM | POA: Diagnosis not present

## 2022-01-01 DIAGNOSIS — N926 Irregular menstruation, unspecified: Secondary | ICD-10-CM

## 2022-01-01 DIAGNOSIS — Z7251 High risk heterosexual behavior: Secondary | ICD-10-CM | POA: Diagnosis not present

## 2022-01-01 DIAGNOSIS — Z7689 Persons encountering health services in other specified circumstances: Secondary | ICD-10-CM

## 2022-01-01 LAB — URINALYSIS, ROUTINE W REFLEX MICROSCOPIC
Bilirubin Urine: NEGATIVE
Ketones, ur: NEGATIVE
Leukocytes,Ua: NEGATIVE
Nitrite: NEGATIVE
Specific Gravity, Urine: 1.025 (ref 1.000–1.030)
Total Protein, Urine: NEGATIVE
Urine Glucose: NEGATIVE
Urobilinogen, UA: 0.2 (ref 0.0–1.0)
pH: 6 (ref 5.0–8.0)

## 2022-01-01 LAB — APTT: aPTT: 25.4 s (ref 23.4–32.7)

## 2022-01-01 LAB — CBC WITH DIFFERENTIAL/PLATELET
Basophils Absolute: 0.1 10*3/uL (ref 0.0–0.1)
Basophils Relative: 1.3 % (ref 0.0–3.0)
Eosinophils Absolute: 0.4 10*3/uL (ref 0.0–0.7)
Eosinophils Relative: 7.3 % — ABNORMAL HIGH (ref 0.0–5.0)
HCT: 42.7 % (ref 36.0–46.0)
Hemoglobin: 14.4 g/dL (ref 12.0–15.0)
Lymphocytes Relative: 30.8 % (ref 12.0–46.0)
Lymphs Abs: 1.7 10*3/uL (ref 0.7–4.0)
MCHC: 33.6 g/dL (ref 30.0–36.0)
MCV: 97.2 fl (ref 78.0–100.0)
Monocytes Absolute: 0.4 10*3/uL (ref 0.1–1.0)
Monocytes Relative: 6.4 % (ref 3.0–12.0)
Neutro Abs: 3 10*3/uL (ref 1.4–7.7)
Neutrophils Relative %: 54.2 % (ref 43.0–77.0)
Platelets: 221 10*3/uL (ref 150.0–400.0)
RBC: 4.4 Mil/uL (ref 3.87–5.11)
RDW: 14.8 % (ref 11.5–15.5)
WBC: 5.6 10*3/uL (ref 4.0–10.5)

## 2022-01-01 LAB — COMPREHENSIVE METABOLIC PANEL
ALT: 19 U/L (ref 0–35)
AST: 23 U/L (ref 0–37)
Albumin: 4 g/dL (ref 3.5–5.2)
Alkaline Phosphatase: 54 U/L (ref 39–117)
BUN: 16 mg/dL (ref 6–23)
CO2: 27 mEq/L (ref 19–32)
Calcium: 8.9 mg/dL (ref 8.4–10.5)
Chloride: 104 mEq/L (ref 96–112)
Creatinine, Ser: 1.02 mg/dL (ref 0.40–1.20)
GFR: 69.92 mL/min (ref >60.00)
Glucose, Bld: 103 mg/dL — ABNORMAL HIGH (ref 70–99)
Potassium: 4 mEq/L (ref 3.5–5.1)
Sodium: 139 mEq/L (ref 135–145)
Total Bilirubin: 0.5 mg/dL (ref 0.2–1.2)
Total Protein: 7.2 g/dL (ref 6.0–8.3)

## 2022-01-01 LAB — PROTIME-INR
INR: 0.9 ratio (ref 0.8–1.0)
Prothrombin Time: 10.4 s (ref 9.6–13.1)

## 2022-01-01 LAB — POCT URINE PREGNANCY: Preg Test, Ur: NEGATIVE

## 2022-01-01 LAB — TSH: TSH: 1.91 u[IU]/mL (ref 0.35–5.50)

## 2022-01-01 NOTE — Assessment & Plan Note (Signed)
Right lower quadrant abdominal mass or tenderness, does appear to extend into the pelvis ?Urine pregnant negative to rule out pregnancy ?Questionable fibroids with history of menorrhagia ?Abdominal and pelvic ultrasound ordered  ?CMP to assess LFTs ?

## 2022-01-01 NOTE — Progress Notes (Signed)
Remainder of labs are normal

## 2022-01-01 NOTE — Assessment & Plan Note (Signed)
Chronic, currently on menses ?Denies any vaginal purulent discharge or vaginal pain ?U pregnancy test negative ?Refer to OB/GYN ?CBC ?PT/INR ?PTT ?TSH ?Follow-up 1 month ? ?

## 2022-01-01 NOTE — Patient Instructions (Signed)
For your abnormal uterine bleeding, we are referring to OB/GYN.  We are also getting blood work to look for clotting or other risk factors. ?We are getting abdominal imaging to assess this mass. ?Please follow-up in 1 month to further assess ?

## 2022-01-01 NOTE — Progress Notes (Signed)
Blood seen in urine, but likely from menses, no signs of infection

## 2022-01-01 NOTE — Progress Notes (Signed)
? ?New Patient Office Visit ? ?Subjective   ? ?Patient ID: Megan [name], female    DOB: [birth year]  Age: 36 y.o. MRN: [id number] ? ?CC:  ?Chief Complaint  ?Patient presents with  ? Establish Care  ?  Np feels as thought there is a growth in her stomach on the rt side x3 years very painful with blood clots during menses.   ? ? ?HPI ?Megan [name] presents to establish care ?Abdominal Pain ? ?She reports chronic abdominal pain. The most recent episode started several months ago and is worsening. The abdominal pain is located in the right lower quadrant and does not radiate. It is described as cramping and pressure-like, is mild and moderate in intensity, occurring constantly. It is aggravated by moving and is relieved by nothing. She has tried nothing to try to relieve pain with no relief. ? ?Also associated with menorrhagia.  Patient has not long family history and personal history of heavy menses with increased pain.  She says that she will routinely pass blood clots.  Next cycle is roughly 7 days.  Has been irregular for the past several months.  That started 4 days ago. ? ?Associated symptoms: ?No anorexia  No belching  ?No bloody stool No blood in urine   ?No constipation No diarrhea  ?No dysuria No fever  ?No flatus No headaches  ?No headaches No joint pains  ?No myalgias No nausea  ?No vomiting No weight loss  ? ?  ?Recent GI studies:none ? ?Relevant medical history includes: n/a ? ?Previous labs ?Lab Results  ?Component Value Date  ? WBC 10.3 03/28/2013  ? HGB 13.9 03/28/2013  ? HCT 39.3 03/28/2013  ? MCV 89.7 03/28/2013  ? MCH 31.7 03/28/2013  ? RDW 12.8 03/28/2013  ? PLT 193 03/28/2013  ? ?Lab Results  ?Component Value Date  ? GLUCOSE 92 02/26/2012  ? NA 138 02/26/2012  ? K 3.9 02/26/2012  ? CL 100 02/26/2012  ? CO2 28 02/26/2012  ? BUN 10 02/26/2012  ? CREATININE 1.06 02/26/2012  ? GFRNONAA 71 (L) 02/26/2012  ? GFRAA 82 (L) 02/26/2012  ? CALCIUM 9.5 02/26/2012  ? PROT 7.5 02/26/2012  ? ALBUMIN 3.7  02/26/2012  ? BILITOT 0.3 02/26/2012  ? ALKPHOS 79 02/26/2012  ? AST 22 02/26/2012  ? ALT 16 02/26/2012  ? ?No results found for: AMYLASE ?----------------------------------------------------------------------------------------- ? ? ?Outpatient Encounter Medications as of 01/01/2022  ?Medication Sig  ? [DISCONTINUED] ibuprofen (ADVIL) 200 MG tablet Take 200 mg by mouth every 6 (six) hours as needed. Pt take 8 a day as needed for pain.  ? [DISCONTINUED] clindamycin (CLEOCIN) 300 MG capsule Take 1 capsule (300 mg total) by mouth 3 (three) times daily.  ? [DISCONTINUED] HYDROcodone-acetaminophen (NORCO/VICODIN) 5-325 MG tablet Take 1 tablet by mouth every 8 (eight) hours as needed for severe pain.  ? [DISCONTINUED] OVER THE COUNTER MEDICATION Take 1-2 tablets by mouth every 6 (six) hours as needed (pain medication).  ? ?No facility-administered encounter medications on file as of 01/01/2022.  ? ? ?Past Medical History:  ?Diagnosis Date  ? Drug dependence ([place])   ? ? ?History reviewed. No pertinent surgical history. ? ?Family History  ?Family history unknown: Yes  ? ? ?Social History  ? ?Socioeconomic History  ? Marital status: Single  ?  Spouse name: Not on file  ? Number of children: Not on file  ? Years of education: Not on file  ? Highest education level: Not on file  ?Occupational History  ?  Not on file  ?Tobacco Use  ? Smoking status: Every Day  ?  Packs/day: 1.00  ?  Years: 18.00  ?  Pack years: 18.00  ?  Types: Cigarettes  ? Smokeless tobacco: Never  ?Vaping Use  ? Vaping Use: Never used  ?Substance and Sexual Activity  ? Alcohol use: Yes  ?  Alcohol/week: 4.0 standard drinks  ?  Types: 4 Cans of beer per week  ?  Comment: weekly  ? Drug use: Yes  ?  Types: Marijuana  ? Sexual activity: Yes  ?Other Topics Concern  ? Not on file  ?Social History Narrative  ? Not on file  ? ?Social Determinants of Health  ? ?Financial Resource Strain: Not on file  ?Food Insecurity: Not on file  ?Transportation Needs: Not on file   ?Physical Activity: Not on file  ?Stress: Not on file  ?Social Connections: Not on file  ?Intimate Partner Violence: Not on file  ?Denies family history of sickle cell anemia.  Patient is sexually active, not using any type of contraceptive. ? ?ROS ?Denies chest pain, shortness of breath ?  ? ? ?Objective   ? ?BP 138/86 (BP Location: Left Arm, Patient Position: Sitting, Cuff Size: Normal)   Pulse 81   Temp 98.2 ?F (36.8 ?C) (Temporal)   Ht $R'5\' 8"'SP$  (1.727 m)   Wt 288 lb 9.6 oz (130.9 kg)   LMP 12/26/2021   SpO2 98%   BMI 43.88 kg/m?  ? ?Gen: NAD, resting comfortably ?CV: RRR with no murmurs appreciated ?Pulm: NWOB, CTAB with no crackles, wheezes, or rhonchi ?GI: Right lower quadrant abdominal mass with tenderness, no other masses palpated, no rebound or guarding ?MSK: no edema, cyanosis, or clubbing noted ?Skin: warm, dry ?Neuro: grossly normal, moves all extremities ?Psych: Normal affect and thought content ? ?  ? ?Assessment & Plan:  ? ?Problem List Items Addressed This Visit   ? ?  ? Other  ? Right lower quadrant abdominal mass  ?  Right lower quadrant abdominal mass or tenderness, does appear to extend into the pelvis ?Urine pregnant negative to rule out pregnancy ?Questionable fibroids with history of menorrhagia ?Abdominal and pelvic ultrasound ordered  ?CMP to assess LFTs ? ?  ?  ? Relevant Orders  ? FAST [place]  ? US Pelvic Complete With Transvaginal  ? Menorrhagia with irregular cycle  ?  Chronic, currently on menses ?Denies any vaginal purulent discharge or vaginal pain ?U pregnancy test negative ?Refer to OB/GYN ?CBC ?PT/INR ?PTT ?TSH ?Follow-up 1 month ? ? ?  ?  ? Relevant Orders  ? Ambulatory referral to Obstetrics / Gynecology  ? Protime-INR  ? PTT  ? TSH  ? US Pelvic Complete With Transvaginal  ? ?Other Visit Diagnoses   ? ? Abdominal pain, generalized    -  Primary  ? Relevant Orders  ? Urinalysis, Routine w reflex microscopic  ? FAST [place]  ? Comp Met (CMET)  ? Irregular periods/menstrual cycles       ? Relevant Orders  ? POCT urine pregnancy (Completed)  ? CBC w/Diff  ? Unprotected sex      ? Relevant Orders  ? POCT urine pregnancy (Completed)  ? Establishing care with new doctor, encounter for      ? ?  ? ? ?Return in about 4 weeks (around 01/29/2022).  ? ?Bonnita Hollow, MD ? ? ?

## 2022-01-04 ENCOUNTER — Emergency Department (HOSPITAL_COMMUNITY): Payer: 59

## 2022-01-04 ENCOUNTER — Emergency Department (HOSPITAL_COMMUNITY)
Admission: EM | Admit: 2022-01-04 | Discharge: 2022-01-05 | Disposition: A | Discharge status: Home / Self Care | Payer: 59 | Attending: Emergency Medicine | Admitting: Emergency Medicine

## 2022-01-04 ENCOUNTER — Encounter (HOSPITAL_COMMUNITY): Payer: Self-pay

## 2022-01-04 DIAGNOSIS — R1084 Generalized abdominal pain: Secondary | ICD-10-CM

## 2022-01-04 DIAGNOSIS — N9489 Other specified conditions associated with female genital organs and menstrual cycle: Secondary | ICD-10-CM | POA: Diagnosis not present

## 2022-01-04 DIAGNOSIS — D219 Benign neoplasm of connective and other soft tissue, unspecified: Secondary | ICD-10-CM | POA: Insufficient documentation

## 2022-01-04 DIAGNOSIS — R109 Unspecified abdominal pain: Secondary | ICD-10-CM | POA: Diagnosis present

## 2022-01-04 DIAGNOSIS — D259 Leiomyoma of uterus, unspecified: Secondary | ICD-10-CM | POA: Insufficient documentation

## 2022-01-04 LAB — COMPREHENSIVE METABOLIC PANEL
ALT: 20 U/L (ref 0–44)
AST: 26 U/L (ref 15–41)
Albumin: 2.6 g/dL — ABNORMAL LOW (ref 3.5–5.0)
Alkaline Phosphatase: 61 U/L (ref 38–126)
Anion gap: 9 (ref 5–15)
BUN: 9 mg/dL (ref 6–20)
CO2: 24 mmol/L (ref 22–32)
Calcium: 9.4 mg/dL (ref 8.9–10.3)
Chloride: 107 mmol/L (ref 98–111)
Creatinine, Ser: 0.92 mg/dL (ref 0.44–1.00)
GFR, Estimated: >60 mL/min (ref >60)
Glucose, Bld: 84 mg/dL (ref 70–99)
Potassium: 3.9 mmol/L (ref 3.5–5.1)
Sodium: 140 mmol/L (ref 135–145)
Total Bilirubin: 0.7 mg/dL (ref 0.3–1.2)
Total Protein: 6 g/dL — ABNORMAL LOW (ref 6.5–8.1)

## 2022-01-04 LAB — CBC
HCT: 42.8 % (ref 36.0–46.0)
Hemoglobin: 15.4 g/dL — ABNORMAL HIGH (ref 12.0–15.0)
MCH: 33.5 pg (ref 26.0–34.0)
MCHC: 36 g/dL (ref 30.0–36.0)
MCV: 93 fL (ref 80.0–100.0)
Platelets: 251 10*3/uL (ref 150–400)
RBC: 4.6 MIL/uL (ref 3.87–5.11)
RDW: 13.4 % (ref 11.5–15.5)
WBC: 9.5 10*3/uL (ref 4.0–10.5)
nRBC: 0 % (ref 0.0–0.2)

## 2022-01-04 LAB — LIPASE, BLOOD: Lipase: 35 U/L (ref 11–51)

## 2022-01-04 LAB — I-STAT BETA HCG BLOOD, ED (MC, WL, AP ONLY): I-stat hCG, quantitative: <5 m[IU]/mL (ref <5)

## 2022-01-04 NOTE — ED Provider Notes (Signed)
?[place] ?Provider Note ? ? ?CSN: [id number] ?Arrival date & time: 01/04/22  2220 ? ?  ? ?History ? ?Chief Complaint  ?Patient presents with  ? Abdominal Pain  ? ? ?Megan [name] is a 36 y.o. female. ? ?36 year old female presents with complaint of pain on the right side of her abdomen.  Patient has noted a mass to the right side of her abdomen for several months, progressively worsening, with recently obtained insurance and ability to see PCP, was seen and referred to a specialist however states that she cannot be seen for 2 weeks and was hoping to expedite her work-up.  She denies nausea, vomiting, diarrhea.  No other complaints or concerns today. ? ? ?  ? ?Home Medications ?Prior to Admission medications   ?Medication Sig Start Date End Date Taking? Authorizing Provider  ?oxyCODONE (ROXICODONE) 5 MG immediate release tablet Take 1 tablet (5 mg total) by mouth every 4 (four) hours as needed for severe pain. 01/05/22  Yes Tacy Learn, PA-C  ?   ? ?Allergies    ?Penicillins   ? ?Review of Systems   ?Review of Systems ?Negative except as per HPI ?Physical Exam ?Updated Vital Signs ?BP 129/85   Pulse 64   Temp 98.5 ?F (36.9 ?C) (Oral)   Resp 15   LMP 12/26/2021   SpO2 99%  ?Physical Exam ?Vitals and nursing note reviewed.  ?Constitutional:   ?   General: She is not in acute distress. ?   Appearance: She is well-developed. She is not diaphoretic.  ?HENT:  ?   Head: Normocephalic and atraumatic.  ?Cardiovascular:  ?   Rate and Rhythm: Normal rate and regular rhythm.  ?   Heart sounds: Normal heart sounds.  ?Pulmonary:  ?   Effort: Pulmonary effort is normal.  ?   Breath sounds: Normal breath sounds.  ?Abdominal:  ?   Palpations: There is mass.  ?   Tenderness: There is generalized abdominal tenderness.  ? ? ?   Comments: Large abdominal mass  ?Skin: ?   General: Skin is warm and dry.  ?Neurological:  ?   Mental Status: She is alert and oriented to person, place, and  time.  ?Psychiatric:     ?   Behavior: Behavior normal.  ? ? ?ED Results / Procedures / Treatments   ?Labs ?(all labs ordered are listed, but only abnormal results are displayed) ?Labs Reviewed  ?COMPREHENSIVE METABOLIC PANEL - Abnormal; Notable for the following components:  ?    Result Value  ? Total Protein 6.0 (*)   ? Albumin 2.6 (*)   ? All other components within normal limits  ?CBC - Abnormal; Notable for the following components:  ? Hemoglobin 15.4 (*)   ? All other components within normal limits  ?LIPASE, BLOOD  ?URINALYSIS, ROUTINE W REFLEX MICROSCOPIC  ?I-STAT BETA HCG BLOOD, ED ([facility], [facility], [facility] ONLY)  ? ? ?EKG ?None ? ?Radiology ?CT Abdomen Pelvis W Contrast ? ?Result Date: 01/05/2022 ?CLINICAL DATA:  Abdominal pain. EXAM: CT ABDOMEN AND PELVIS WITH CONTRAST TECHNIQUE: Multidetector CT imaging of the abdomen and pelvis was performed using the standard protocol following bolus administration of intravenous contrast. RADIATION DOSE REDUCTION: This exam was performed according to the departmental dose-optimization program which includes automated exposure control, adjustment of the mA and/or kV according to patient size and/or use of iterative reconstruction technique. CONTRAST:  166m OMNIPAQUE IOHEXOL 300 MG/ML  SOLN COMPARISON:  None Available. FINDINGS: Lower chest: The visualized  lung bases are clear. No intra-abdominal free air or free fluid. Hepatobiliary: The liver is unremarkable. No intrahepatic biliary dilatation. The gallbladder is contracted. No calcified gallstone. Pancreas: Unremarkable. No pancreatic ductal dilatation or surrounding inflammatory changes. Spleen: Normal in size without focal abnormality. Adrenals/Urinary Tract: The adrenal glands are unremarkable. The kidneys, and the visualized ureters appear unremarkable. The urinary bladder is collapsed. Stomach/Bowel: There is no bowel obstruction or active inflammation. The appendix is normal. Vascular/Lymphatic: The abdominal aorta and IVC  are unremarkable. No portal venous gas. There is no adenopathy. Reproductive: Enlarged myomatous uterus with multiple fibroids. The uterus measures approximately 23 cm in craniocaudal length with a large fundal fibroid extending into the abdomen and displacing bowel loops and causing some compression on the IVC. The largest exophytic fundal fibroid measures approximately 21 x 15 x 20 cm. Gynecology referral is advised. Other: None Musculoskeletal: Degenerative changes of the lower lumbar spine. No acute osseous pathology. IMPRESSION: 1. Enlarged myomatous uterus with a large exophytic fundal fibroid extending into the abdomen. Gynecology referral is advised. 2. No bowel obstruction. Normal appendix. Electronically Signed   By: Anner Crete M.D.   On: 01/05/2022 00:17   ? ?Procedures ?Procedures  ? ? ?Medications Ordered in ED ?Medications  ?iohexol (OMNIPAQUE) 300 MG/ML solution 100 mL (100 mLs Intravenous Contrast Given 01/05/22 0002)  ?oxyCODONE-acetaminophen (PERCOCET/ROXICET) 5-325 MG per tablet 1 tablet (1 tablet Oral Given 01/05/22 0043)  ? ? ?ED Course/ Medical Decision Making/ A&P ?  ?                        ?Medical Decision Making ?Amount and/or Complexity of Data Reviewed ?Labs: ordered. ?Radiology: ordered. ? ?Risk ?Prescription drug management. ? ? ?This patient presents to the ED for concern of abdominal pain, discomfort, this involves an extensive number of treatment options, and is a complaint that carries with it a high risk of complications and morbidity.  The differential diagnosis includes fibroid uterus, mass, bowel obstruction  ? ? ?Co morbidities that complicate the patient evaluation ? ?Reports marijuana and alcohol use, non smoker ? ? ?Additional history obtained: ? ?External records from outside source obtained and reviewed including PCP visit 01/01/22- ordered abdominal and pelvic US as well as labs with referral to GYN for heavy menstrual cycles ? ? ?Lab Tests: ? ?I Ordered, and personally  interpreted labs.  The pertinent results include:  CBC, CMP, lipase, ua, no significant findings. ? ? ?Imaging Studies ordered: ? ?I ordered imaging studies including CT abdomen/pelvis  ?I independently visualized and interpreted imaging which showed large pelvic mass; measured by radiology- uterus 23cm, suspect fibroid uterus without bowel obstruction resulting in some compression on the IVC ?I agree with the radiologist interpretation ? ? ?Problem List / ED Course / Critical interventions / Medication management ? ?36 year old female with complaint of painful abdominal mass, found to have palpable large abdominal pain extending just above the umbilicus and more so to the right than left of center.  ?CT with likely large fibroid uterus which somewhat compresses the IVC. Has strong pedal pulses, discussed with patient, much like a gravid uterus, may be more com ?I ordered medication including percocet  for pain  ?Reevaluation of the patient after these medicines showed that the patient improved ?I have reviewed the patients home medicines and have made adjustments as needed ? ? ?Social Determinants of Health: ? ?Has PCP, recently referred to GYN ? ? ?Test / Admission - Considered: ? ?Admission  considered however after work up complete, patient is stable to continue outpatient work up and evaluation.  ? ? ? ? ? ? ? ? ?Final Clinical Impression(s) / ED Diagnoses ?Final diagnoses:  ?Generalized abdominal pain  ?Uterine leiomyoma, unspecified location  ? ? ?Rx / DC Orders ?ED Discharge Orders   ? ?      Ordered  ?  oxyCODONE (ROXICODONE) 5 MG immediate release tablet  Every 4 hours PRN       ? 01/05/22 0035  ? ?  ?  ? ?  ? ? ?  ?Tacy Learn, PA-C ?01/05/22 1027 ? ?  ?Truddie Hidden, MD ?01/05/22 [phone] ? ?

## 2022-01-04 NOTE — ED Triage Notes (Signed)
Pt reports she is having abd pain. Pt reports x2 weeks ago she was dx with mass in abd . Pt denies any N&V&D. Pt reports she is able to eat and drink. Pt reports OTC pain meds is not working. Pt reports her PCP told her to come if the pain got worse. Pt reports it will be x2 weeks until she can see a specialist.  ?

## 2022-01-05 ENCOUNTER — Emergency Department (HOSPITAL_COMMUNITY): Payer: 59

## 2022-01-05 MED ORDER — OXYCODONE HCL 5 MG PO TABS
5.0000 mg | ORAL_TABLET | ORAL | 0 refills | Status: DC | PRN
Start: 2022-01-05 — End: 2022-01-19

## 2022-01-05 MED ORDER — IOHEXOL 300 MG/ML  SOLN
100.0000 mL | Freq: Once | INTRAMUSCULAR | Status: AC | PRN
  Administered 2022-01-05: 100 mL via INTRAVENOUS

## 2022-01-05 MED ORDER — OXYCODONE-ACETAMINOPHEN 5-325 MG PO TABS
1.0000 | ORAL_TABLET | Freq: Once | ORAL | Status: AC
  Administered 2022-01-05: 1 via ORAL
  Filled 2022-01-05: qty 1

## 2022-01-05 NOTE — Discharge Instructions (Signed)
Continue Motrin and Tylenol as needed as directed for pain.  Can take oxycodone as prescribed for pain not relieved with Motrin and Tylenol.  Do not drive or operate machinery if taking oxycodone. ? ?Follow up with the referral provided by your PCP. You can also call your PCP office to discuss your results from tonight. ? ?Additional gynecologic resources if needed: ? ?[facility]  ?[place]  ?[phone]  ? ?[facility] at [facility]  ?[address]  ?([place]  ? ?[facility] at [place]  ?[place]  ?[phone]  ? ?[facility]  ?[place]  ?[phone]  ? ?[facility] at [place]  ?[place]  ?[phone]  ? ?

## 2022-01-06 ENCOUNTER — Telehealth: Payer: Self-pay | Admitting: Family Medicine

## 2022-01-06 NOTE — Telephone Encounter (Signed)
Pt called to say she had a CT scan in the ER and is asking if she still needs the scan she was referred for.  ?

## 2022-01-06 NOTE — Telephone Encounter (Signed)
Spoke to patient informed of provider instructions and comments. Pt verbalized understanding. As, cma  ?

## 2022-01-19 ENCOUNTER — Ambulatory Visit (INDEPENDENT_AMBULATORY_CARE_PROVIDER_SITE_OTHER): Payer: 59 | Admitting: Family Medicine

## 2022-01-19 ENCOUNTER — Encounter: Payer: Self-pay | Admitting: Family Medicine

## 2022-01-19 ENCOUNTER — Telehealth: Payer: Self-pay | Admitting: Family Medicine

## 2022-01-19 VITALS — BP 124/70 | HR 83 | Temp 96.9°F | Ht 68.0 in | Wt 286.8 lb

## 2022-01-19 DIAGNOSIS — N921 Excessive and frequent menstruation with irregular cycle: Secondary | ICD-10-CM

## 2022-01-19 DIAGNOSIS — Z23 Encounter for immunization: Secondary | ICD-10-CM | POA: Diagnosis not present

## 2022-01-19 DIAGNOSIS — D219 Benign neoplasm of connective and other soft tissue, unspecified: Secondary | ICD-10-CM | POA: Diagnosis not present

## 2022-01-19 LAB — POCT URINE PREGNANCY: Preg Test, Ur: NEGATIVE

## 2022-01-19 MED ORDER — MELOXICAM 15 MG PO TABS: 15.0000 mg | ORAL_TABLET | Freq: Every day | ORAL | 1 refills | Status: DC

## 2022-01-19 MED ORDER — MEDROXYPROGESTERONE ACETATE 150 MG/ML IM SUSP
150.0000 mg | Freq: Once | INTRAMUSCULAR | Status: AC
  Administered 2022-01-19: 150 mg via INTRAMUSCULAR

## 2022-01-19 MED ORDER — OXYCODONE-ACETAMINOPHEN 5-325 MG PO TABS: 1.0000 | ORAL_TABLET | ORAL | 0 refills | Status: AC | PRN

## 2022-01-19 NOTE — Progress Notes (Signed)
   Megan [name] is a 36 y.o. female who presents today for an office visit.  Assessment/Plan:  New/Acute Problems:   Chronic Problems Addressed Today: Fibroid Patient with large fibroid 20 cm x 20 cm found on recent CT scan Associated abdominal pain and menorrhagia **Urine pregnancy negative Follows with OB/GYN in July **Depo Provera given Meloxicam 15 mg daily as needed Oxycodone 5 mg every 4 hours severe/breakthrough pain     Subjective:  HPI:  Patient to follow-up on abdominal pain irregular menses associated with recently discovered fibroid.  Patient went to emergency department 12/2021 recently after seeing me, had a CT of abdomen which showed single exophytic fibroid measuring approximately 20 x 20 cm.  Patient was given oxycodone as needed and ED.  She also takes ibuprofen which does not help.  She has follow-up with OB/GYN already scheduled in July.  She is tearful because she says she will never be able to have a child, discussed the likelihood of getting hysterectomy, although advised patient that she will need to discuss further with OB/GYN.       Objective:  Physical Exam: BP 124/70 (BP Location: Right Arm, Patient Position: Sitting, Cuff Size: Normal)   Pulse 83   Temp (!) 96.9 F (36.1 C) (Temporal)   Ht '5\' 8"'$  (1.727 m)   Wt 286 lb 12.8 oz (130.1 kg)   LMP 12/26/2021   SpO2 97%   BMI 43.61 kg/m   Gen: No acute distress, resting comfortably ABD: Abdominal mass right lower quadrant consistent with fibroid, mildly tender to palpation Neuro: Grossly normal, moves all extremities Psych: Tearful       01/19/2022 8:59 AM

## 2022-01-19 NOTE — Telephone Encounter (Signed)
Caller Name: Carmine Youngberg Call back phone #: [phone]  MEDICATION(S): oxyCODONE (ROXICODONE) 5 MG immediate release tablet [[id number]]    Days of Med Remaining: none, she was just seen today and is under the impression she is getting this filled   Preferred Pharmacy: [facility] [id number] - [place], [place]  [address] [place], [place] [address]  Phone:  [phone]  Fax:  [phone]   ~~~Please advise patient/caregiver to allow 2-3 business days to process RX refills.

## 2022-01-19 NOTE — Addendum Note (Signed)
Addended by: Bonnita Hollow on: 01/19/2022 02:38 PM   Modules accepted: Orders

## 2022-01-19 NOTE — Patient Instructions (Signed)
For heavy periods and thyroid pain, we are trying meloxicam and oxycodone sparingly. We are doing Depo-Provera try to decrease irregular periods. Follow-up with OB/GYN in July as scheduled

## 2022-01-19 NOTE — Assessment & Plan Note (Signed)
Patient with large fibroid 20 cm x 20 cm found on recent CT scan Associated abdominal pain and menorrhagia **Urine pregnancy negative Follows with OB/GYN in July **Depo Provera given Meloxicam 15 mg daily as needed Oxycodone 5 mg every 4 hours severe/breakthrough pain

## 2022-01-20 NOTE — Telephone Encounter (Signed)
LVM with provider instructions.

## 2022-01-23 ENCOUNTER — Other Ambulatory Visit: Payer: Self-pay | Admitting: Family Medicine

## 2022-01-23 DIAGNOSIS — D219 Benign neoplasm of connective and other soft tissue, unspecified: Secondary | ICD-10-CM

## 2022-01-23 DIAGNOSIS — N921 Excessive and frequent menstruation with irregular cycle: Secondary | ICD-10-CM

## 2022-01-27 NOTE — Telephone Encounter (Signed)
Pt called to f/u on refill. She was advised appt is needed and she is scheduled for 01/28/22.

## 2022-01-28 ENCOUNTER — Encounter: Payer: Self-pay | Admitting: Family Medicine

## 2022-01-28 ENCOUNTER — Ambulatory Visit (INDEPENDENT_AMBULATORY_CARE_PROVIDER_SITE_OTHER): Payer: 59 | Admitting: Family Medicine

## 2022-01-28 VITALS — BP 131/78 | HR 83 | Temp 96.3°F | Ht 68.0 in | Wt 284.4 lb

## 2022-01-28 DIAGNOSIS — K047 Periapical abscess without sinus: Secondary | ICD-10-CM | POA: Insufficient documentation

## 2022-01-28 DIAGNOSIS — D219 Benign neoplasm of connective and other soft tissue, unspecified: Secondary | ICD-10-CM | POA: Diagnosis not present

## 2022-01-28 HISTORY — DX: Periapical abscess without sinus: K04.7

## 2022-01-28 MED ORDER — DICLOFENAC SODIUM 75 MG PO TBEC: 75.0000 mg | DELAYED_RELEASE_TABLET | Freq: Two times a day (BID) | ORAL | 1 refills | Status: AC

## 2022-01-28 MED ORDER — CEFPROZIL 500 MG PO TABS: 500.0000 mg | ORAL_TABLET | Freq: Two times a day (BID) | ORAL | 0 refills | Status: AC

## 2022-01-28 NOTE — Patient Instructions (Addendum)
Try the diclofenac for pain.  If no improvement please follow-up in 2 weeks Takes Cefzil for tooth infection

## 2022-01-28 NOTE — Assessment & Plan Note (Signed)
Chronic pain moderately improved with intermittent meloxicam and/or  Discussed risk and benefits of continuing opioid medication Discharge decision making we decided DC meloxicam and trial diclofenac If no improvement can consider continuing oxycodone, if to continue I discussed the short-term nature of opioid prescribing from this clinic, typically only 3 months, otherwise need to follow-up with chronic pain management Follows up with OB/GYN in July Follow-up in 2 weeks

## 2022-01-28 NOTE — Assessment & Plan Note (Signed)
Trial ceftezole

## 2022-01-28 NOTE — Progress Notes (Signed)
   Megan [name] is a 36 y.o. female who presents today for an office visit.  Assessment/Plan:   Chronic Problems Addressed Today: No problem-specific Assessment & Plan notes found for this encounter.     Subjective:  HPI:  Patient presents for follow-up for ongoing pain from fibroids.  Also associated with menorrhagia.  Did not have any improvement with menses status post Depo-Provera injection.  Patient does have scheduled follow-up with OB/GYN in July.  Pain is improved moderately with meloxicam and oxycodone combined, but does still have significant pain.  It is affecting her ability to live due to abdominal pain.  Not associated with any other abdominal symptoms including nausea or vomiting or melena or blood in stool.  Patient reports also ongoing tooth pain from 2 different dental caries in the back overall.  Denies any fevers or drainage.  Able to tolerate p.o. well.  Pain is also improved on meloxicam.      Objective:  Physical Exam: BP 131/78 (BP Location: Right Arm, Patient Position: Sitting, Cuff Size: Large)   Pulse 83   Temp (!) 96.3 F (35.7 C) (Temporal)   Ht '5\' 8"'$  (1.727 m)   Wt 284 lb 6.4 oz (129 kg)   SpO2 98%   BMI 43.24 kg/m   Gen: No acute distress, resting comfortably HEENT: dental caries and molars on rosuvastatin back of mouth CV: Regular rate and rhythm with no murmurs appreciated Pulm: Normal work of breathing, clear to auscultation bilaterally with no crackles, wheezes, or rhonchi GI: Neuro: Grossly normal, moves all extremities Psych: Normal affect and thought content     Problem List Items Addressed This Visit       Other   Fibroid - Primary    Chronic pain moderately improved with intermittent meloxicam and/or  Discussed risk and benefits of continuing opioid medication Discharge decision making we decided DC meloxicam and trial diclofenac If no improvement can consider continuing oxycodone, if to continue I discussed the short-term  nature of opioid prescribing from this clinic, typically only 3 months, otherwise need to follow-up with chronic pain management Follows up with OB/GYN in July Follow-up in 2 weeks       Relevant Medications   diclofenac (VOLTAREN) 75 MG EC tablet   Other Visit Diagnoses     Tooth infection       Relevant Medications   cefPROZIL (CEFZIL) 500 MG tablet       Return in about 2 weeks (around 02/11/2022).     Bonnita Hollow, MD          Algis Greenhouse. [name], MD 01/28/2022 5:16 PM

## 2022-01-29 ENCOUNTER — Ambulatory Visit: Payer: 59 | Admitting: Family Medicine

## 2022-02-11 ENCOUNTER — Encounter: Payer: Self-pay | Admitting: Family Medicine

## 2022-02-11 ENCOUNTER — Ambulatory Visit (INDEPENDENT_AMBULATORY_CARE_PROVIDER_SITE_OTHER): Payer: 59 | Admitting: Family Medicine

## 2022-02-11 VITALS — BP 138/86 | HR 75 | Temp 97.9°F | Wt 287.2 lb

## 2022-02-11 DIAGNOSIS — R1084 Generalized abdominal pain: Secondary | ICD-10-CM

## 2022-02-11 DIAGNOSIS — D219 Benign neoplasm of connective and other soft tissue, unspecified: Secondary | ICD-10-CM

## 2022-02-11 DIAGNOSIS — N921 Excessive and frequent menstruation with irregular cycle: Secondary | ICD-10-CM

## 2022-02-11 LAB — CBC WITH DIFFERENTIAL/PLATELET
Basophils Absolute: 0.1 10*3/uL (ref 0.0–0.1)
Basophils Relative: 1.1 % (ref 0.0–3.0)
Eosinophils Absolute: 0.3 10*3/uL (ref 0.0–0.7)
Eosinophils Relative: 2.7 % (ref 0.0–5.0)
HCT: 40.9 % (ref 36.0–46.0)
Hemoglobin: 13.9 g/dL (ref 12.0–15.0)
Lymphocytes Relative: 23.3 % (ref 12.0–46.0)
Lymphs Abs: 2.3 10*3/uL (ref 0.7–4.0)
MCHC: 34 g/dL (ref 30.0–36.0)
MCV: 96.3 fl (ref 78.0–100.0)
Monocytes Absolute: 0.5 10*3/uL (ref 0.1–1.0)
Monocytes Relative: 4.7 % (ref 3.0–12.0)
Neutro Abs: 6.7 10*3/uL (ref 1.4–7.7)
Neutrophils Relative %: 68.2 % (ref 43.0–77.0)
Platelets: 184 10*3/uL (ref 150.0–400.0)
RBC: 4.24 Mil/uL (ref 3.87–5.11)
RDW: 13.4 % (ref 11.5–15.5)
WBC: 9.8 10*3/uL (ref 4.0–10.5)

## 2022-02-11 MED ORDER — ONDANSETRON HCL 4 MG PO TABS: 4.0000 mg | ORAL_TABLET | Freq: Three times a day (TID) | ORAL | 0 refills | Status: DC | PRN

## 2022-02-11 MED ORDER — MEDROXYPROGESTERONE ACETATE 10 MG PO TABS: 10.0000 mg | ORAL_TABLET | Freq: Three times a day (TID) | ORAL | 0 refills | Status: DC

## 2022-02-11 MED ORDER — OXYCODONE-ACETAMINOPHEN 5-325 MG PO TABS: 1.0000 | ORAL_TABLET | ORAL | 0 refills | Status: DC | PRN

## 2022-02-11 MED ORDER — OXYCODONE-ACETAMINOPHEN 5-325 MG PO TABS: 1.0000 | ORAL_TABLET | Freq: Four times a day (QID) | ORAL | 0 refills | Status: DC | PRN

## 2022-02-11 NOTE — Patient Instructions (Signed)
For menstrual bleeding, take the Provera 3 times a day For pain, take oxycodone

## 2022-02-11 NOTE — Progress Notes (Signed)
   Megan [name] is a 36 y.o. female who presents today for an office visit.  Assessment/Plan:   Chronic Problems Addressed Today: No problem-specific Assessment & Plan notes found for this encounter.     Subjective:  HPI:  Patient presents for follow-up for ongoing [name] from fibroids.  Also associated with menorrhagia.  Did not have any improvement with menses status post Depo-Provera injection.  Patient does have scheduled follow-up with OB/GYN in July.  [name] is improved moderately with meloxicam and oxycodone combined, but does still have significant [name].  It is affecting her ability to live due to abdominal [name].  Not associated with any other abdominal symptoms including nausea or vomiting or melena or blood in stool.  Patient reports also ongoing tooth [name] from 2 different dental caries in the back overall.  Denies any fevers or drainage.  Able to tolerate p.o. well.  [name] is also improved on meloxicam.  Update 02/11/2022 Tooth [name] improved Patient reports ongoing abdominal [name] secondary to fibroids.  Also reports fatigue with heavy menorrhagia over the past several days.  Patient did have some improvement with the Depo-Provera, however it is still ongoing.  [name] somewhat improved on diclofenac, however not adequate control.        Objective:  Physical Exam: BP 138/86 (BP Location: Left Arm, Patient Position: Sitting, Cuff Size: Large)   Pulse 75   Temp 97.9 F (36.6 C) (Temporal)   Wt 287 lb 3.2 oz (130.3 kg)   LMP 02/02/2022   SpO2 98%   BMI 43.67 kg/m   Gen: No acute distress, resting comfortably HEENT: dental caries and molars on rosuvastatin back of mouth CV: Regular rate and rhythm with no murmurs appreciated Pulm: Normal work of breathing, clear to auscultation bilaterally with no crackles, wheezes, or rhonchi Abdomen: Large abdominal/pelvic fibroids palpated on exam GI: Neuro: Grossly normal, moves all extremities Psych: Normal affect and thought  content     Problem List Items Addressed This Visit       Other   Menorrhagia with irregular cycle   Relevant Medications   medroxyPROGESTERone (PROVERA) 10 MG tablet   Other Relevant Orders   CBC with Differential/Platelet   Fibroid tumor - Primary   Relevant Medications   medroxyPROGESTERone (PROVERA) 10 MG tablet   oxyCODONE-acetaminophen (PERCOCET/ROXICET) 5-325 MG tablet   Other Relevant Orders   ToxASSURE Select 12(MW)   Other Visit Diagnoses     Abdominal [name], generalized       Relevant Medications   ondansetron (ZOFRAN) 4 MG tablet   oxyCODONE-acetaminophen (PERCOCET/ROXICET) 5-325 MG tablet   Other Relevant Orders   ToxASSURE Select 12(MW)       Return in about 4 weeks (around 03/11/2022).     Megan Hollow, MD          Megan [name]. Megan Pain, MD 02/11/2022 10:01 AM

## 2022-02-11 NOTE — Assessment & Plan Note (Signed)
Has ongoing pain, not adequately controlled on NSAIDs Patient requiring opioid management at least until follow-up with OB/GYN UDS obtained Oxycodone 5 mg / 325 mg, every 6 to every 8 hours for 1 month Continue diclofenac

## 2022-02-11 NOTE — Assessment & Plan Note (Signed)
Associated with fatigue Mild improvement with Depo-Provera CBC to rule out anemia, although CBC normal on 12/2021 Start Provera 10 mg 3 times daily for 7 days Encourage smoking cessation, counseled Follow-up 1 month

## 2022-02-17 LAB — TOXASSURE SELECT 12(MW)

## 2022-02-18 ENCOUNTER — Other Ambulatory Visit: Payer: Self-pay | Admitting: Family Medicine

## 2022-02-18 DIAGNOSIS — D219 Benign neoplasm of connective and other soft tissue, unspecified: Secondary | ICD-10-CM

## 2022-02-18 DIAGNOSIS — R1084 Generalized abdominal pain: Secondary | ICD-10-CM

## 2022-02-19 MED ORDER — OXYCODONE-ACETAMINOPHEN 5-325 MG PO TABS: 1.0000 | ORAL_TABLET | Freq: Four times a day (QID) | ORAL | 0 refills | Status: DC | PRN

## 2022-03-09 ENCOUNTER — Encounter: Payer: Self-pay | Admitting: Obstetrics and Gynecology

## 2022-03-09 ENCOUNTER — Other Ambulatory Visit (HOSPITAL_COMMUNITY)
Admission: RE | Admit: 2022-03-09 | Discharge: 2022-03-09 | Disposition: A | Discharge status: Home / Self Care | Payer: 59 | Source: Ambulatory Visit | Attending: Obstetrics and Gynecology | Admitting: Obstetrics and Gynecology

## 2022-03-09 ENCOUNTER — Ambulatory Visit (INDEPENDENT_AMBULATORY_CARE_PROVIDER_SITE_OTHER): Payer: 59 | Admitting: Obstetrics and Gynecology

## 2022-03-09 ENCOUNTER — Other Ambulatory Visit: Payer: Self-pay

## 2022-03-09 VITALS — BP 130/77 | HR 89 | Ht 67.5 in | Wt 283.1 lb

## 2022-03-09 DIAGNOSIS — N939 Abnormal uterine and vaginal bleeding, unspecified: Secondary | ICD-10-CM | POA: Insufficient documentation

## 2022-03-09 DIAGNOSIS — D219 Benign neoplasm of connective and other soft tissue, unspecified: Secondary | ICD-10-CM | POA: Diagnosis not present

## 2022-03-09 DIAGNOSIS — Z6841 Body Mass Index (BMI) 40.0 and over, adult: Secondary | ICD-10-CM | POA: Insufficient documentation

## 2022-03-09 DIAGNOSIS — Z124 Encounter for screening for malignant neoplasm of cervix: Secondary | ICD-10-CM | POA: Insufficient documentation

## 2022-03-09 DIAGNOSIS — N921 Excessive and frequent menstruation with irregular cycle: Secondary | ICD-10-CM

## 2022-03-09 HISTORY — PX: ENDOMETRIAL BIOPSY: PRO73

## 2022-03-09 LAB — POCT PREGNANCY, URINE: Preg Test, Ur: NEGATIVE

## 2022-03-09 MED ORDER — TRANEXAMIC ACID 650 MG PO TABS: 1300.0000 mg | ORAL_TABLET | Freq: Three times a day (TID) | ORAL | 2 refills | Status: DC

## 2022-03-09 NOTE — Procedures (Signed)
Endometrial Biopsy Procedure Note  Pre-operative Diagnosis: AUB. Depo provera. Large fibroid uterus  Post-operative Diagnosis: same  Procedure Details  Urine pregnancy test was done and result was negative.  The risks (including infection, bleeding, pain, and uterine perforation) and benefits of the procedure were explained to the patient and Written informed consent was obtained.  The patient was placed in the dorsal lithotomy position.  A Graves' speculum inserted in the vagina, and the cervix was visualized and a pap smear performed. The cervix was then prepped with povidone iodine, and a sharp tenaculum was applied to the anterior lip of the cervix for stabilization.  A pipelle was inserted into the uterine cavity and sounded the uterus to a depth of 7cm.  A Minimal amount of tissue/clot was collected after 2 passes. The sample was sent for pathologic examination.  Condition: Stable  Complications: None  Plan: I discussed her that I'm not sure if I got a uterine sample but even so, it would be very limited due to large, likely obstructing fibroids. The patient was advised to call for any fever or for prolonged or severe pain or bleeding. She was advised to use OTC analgesics as needed for mild to moderate pain. She was advised to avoid vaginal intercourse for 48 hours or until the bleeding has completely stopped.  Durene Romans MD Attending [facility] [employer] [facility])

## 2022-03-09 NOTE — Progress Notes (Signed)
Obstetrics and Gynecology New Patient Evaluation  Appointment Date: 03/09/2022  OBGYN Clinic: [facility]  Primary Care Provider: Josephine Igo B  Referring Provider: Bonnita Hollow, MD  Chief Complaint: Large fibroid uterus   History of Present Illness: Megan [name] is a 36 y.o. African-American [id number] (Patient's last menstrual period was 02/02/2022.), seen for the above chief complaint. Her past medical history is significant for BMI 40s   She began noticing menorrhagia and abdominal fullness in the past year that has been increasing. She endorses a long history heavy menstrual bleeding and passing frequent clots with her periods; she is not aware she had a history of fibroids before her work up this year.  She received a depo shot on 01/19/22 from her PCP and po depo on 02/11/22, due to AUB when she started the depo provera.  Prior to the depo, her periods were heavy and painful but only about a week and no intermenstrual bleeding. Pt states she was put on a 7d course of PO provera in mid June but that didn't really help with the AUB.   Her pain is at an 8 or 9 out of 10. She manages her pain with q6 percocet.   She endorses fatigue, nausea, and occasional vomiting, that responds well to zofran.     She desires pregnancy in the future, and is tearful when thinking about future fertility.   Review of Systems: Pertinent items noted in HPI and remainder of comprehensive ROS otherwise negative.    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  Past Obstetrical History:  OB History  Gravida Para Term Preterm AB  Living  1       1    SAB IAB Ectopic Multiple Live Births  1            # Outcome Date GA Lbr Len/2nd Weight Sex Delivery Anes PTL Lv  1 SAB             Obstetric Comments  G1: SAB, date unknown but "years ago"    Past Gynecological History: As per HPI. Periods: Prior to being started on depo provera, her periods were qmonth, regular, 4-7 days, heavy and painful with no intermenstrual bleeding History of Pap Smear(s): Yes.   Last pap unknown She is currently using Depo-Provera for contraception; injection on 01/19/22   Social History:  Social History   Socioeconomic History   Marital status: Single    Spouse name: Not on file   Number of children: Not on file   Years of education: Not on file   Highest education level: Not on file  Occupational History   Not on file  Tobacco Use   Smoking status: Every Day    Packs/day: 1.00    Years: 18.00    Total pack years: 18.00    Types: Cigarettes   Smokeless tobacco: Never  Vaping Use   Vaping Use: Never used  Substance and Sexual Activity   Alcohol use: Yes    Alcohol/week: 4.0 standard drinks of alcohol    Types: 4 Cans of beer per week    Comment: weekly  Drug use: Yes    Types: Marijuana   Sexual activity: Yes  Other Topics Concern   Not on file  Social History Narrative   Not on file   Social Determinants of Health   Financial Resource Strain: Not on file  Food Insecurity: Not on file  Transportation Needs: Not on file  Physical Activity: Not on file  Stress: Not on file  Social Connections: Not on file  Intimate Partner Violence: Not on file    Family History:  Family History  Family history unknown: Yes   Medications Pranathi Wogan had no medications administered during this visit. Current Outpatient Medications  Medication Sig Dispense Refill   diclofenac (VOLTAREN) 75 MG EC tablet Take 1 tablet (75 mg total) by mouth 2 (two) times daily. 60 tablet 1   ondansetron (ZOFRAN) 4 MG tablet Take 1  tablet (4 mg total) by mouth every 8 (eight) hours as needed for nausea or vomiting. 20 tablet 0   oxyCODONE-acetaminophen (PERCOCET/ROXICET) 5-325 MG tablet Take 1 tablet by mouth every 6 (six) hours as needed for severe pain. 100 tablet 0   No current facility-administered medications for this visit.    Allergies Penicillins   Physical Exam:  BP 130/77   Pulse 89   Ht 5' 7.5" (1.715 m)   Wt 283 lb 1.6 oz (128.4 kg)   LMP 02/02/2022   BMI 43.69 kg/m  Body mass index is 43.69 kg/m.  General appearance: Well nourished, well developed female in no acute distress.  Neck:  Supple, normal appearance, and no thyromegaly  Cardiovascular: normal s1 and s2.  No murmurs, rubs or gallops. Respiratory:  Clear to auscultation bilateral. Normal respiratory effort Abdomen: soft, nttp. Large mass, approximately 26-28wk sized and very wide, approx 10cm lateral on either side of her midline all the way down to her pelvis, nttp Neuro/Psych:  Normal mood and affect.  Skin:  Warm and dry.  Lymphatic:  No inguinal lymphadenopathy.   Pelvic exam: is not limited by body habitus EGBUS: within normal limits Vagina: scant old blood in the vault, no active bleeding  Cervix: normal appearing cervix without tenderness, discharge or lesions.  Rectovaginal: deferred  Laboratory: UPT negative    Latest Ref Rng & Units 02/11/2022    9:30 AM 01/04/2022   10:35 PM 01/01/2022   10:56 AM  CBC  WBC 4.0 - 10.5 K/uL 9.8  9.5  5.6   Hemoglobin 12.0 - 15.0 g/dL 13.9  15.4  14.4   Hematocrit 36.0 - 46.0 % 40.9  42.8  42.7   Platelets 150.0 - 400.0 K/uL 184.0  251  221.0   6/14 UDS neg 5/4 TSH wnl  Radiology: Narrative & Impression  CLINICAL DATA:  Abdominal pain.   EXAM: CT ABDOMEN AND PELVIS WITH CONTRAST   TECHNIQUE: Multidetector CT imaging of the abdomen and pelvis was performed using the standard protocol following bolus administration of intravenous contrast.   RADIATION DOSE REDUCTION: This exam  was performed according to the departmental dose-optimization program which includes automated exposure control, adjustment of the mA and/or kV according to patient size and/or use of iterative reconstruction technique.   CONTRAST:  123m OMNIPAQUE IOHEXOL 300 MG/ML  SOLN   COMPARISON:  None Available.   FINDINGS: Lower chest: The visualized lung bases are clear.   No intra-abdominal free air or free fluid.   Hepatobiliary: The liver is unremarkable. No intrahepatic biliary dilatation. The gallbladder is contracted. No calcified gallstone.   Pancreas: Unremarkable. No pancreatic ductal dilatation  or surrounding inflammatory changes.   Spleen: Normal in size without focal abnormality.   Adrenals/Urinary Tract: The adrenal glands are unremarkable. The kidneys, and the visualized ureters appear unremarkable. The urinary bladder is collapsed.   Stomach/Bowel: There is no bowel obstruction or active inflammation. The appendix is normal.   Vascular/Lymphatic: The abdominal aorta and IVC are unremarkable. No portal venous gas. There is no adenopathy.   Reproductive: Enlarged myomatous uterus with multiple fibroids. The uterus measures approximately 23 cm in craniocaudal length with a large fundal fibroid extending into the abdomen and displacing bowel loops and causing some compression on the IVC. The largest exophytic fundal fibroid measures approximately 21 x 15 x 20 cm. Gynecology referral is advised.   Other: None   Musculoskeletal: Degenerative changes of the lower lumbar spine. No acute osseous pathology.   IMPRESSION: 1. Enlarged myomatous uterus with a large exophytic fundal fibroid extending into the abdomen. Gynecology referral is advised. 2. No bowel obstruction. Normal appendix.     Electronically Signed   By: Anner Crete M.D.   On: 01/05/2022 00:17    Assessment: pt stable  Plan:  1. Cervical cancer screening  - Cytology - PAP( [place])  2.  Fibroids The fact that the fibroids grew so fast is concerning, although I told her so consider this a risk factor for malignancy and some do not. I told her the limitations of endometrial biopsy today to complete her work up and she was amenable to this. I told her that I recommend evaluation by Gyn Onc to go over this as a risk. I told her that if Gyn Onc does not believe her risk of malignancy is increased from baseline, that they may recommend she be evaluated by IR to see if a [place] is feasible. I also told her that if she elects for surgical management via hysterectomy that this would be a very difficult and involved surgery and that Gyn Onc would likely be the best practitioners for this.  - Surgical pathology( [place]/ [facility])  3. Breakthrough bleeding on depo provera I d/w her common and hopefully will stop once depo is out of her system. D/w her re: r/b to [place] and pt amenable to 5 days course - Anemia Profile B - Ambulatory referral to Gynecologic Oncology - Surgical pathology( [place])  4. BMI 40.0-44.9, adult ([place])  RTC PRN  Durene Romans MD Attending Center for [facility])

## 2022-03-10 LAB — ANEMIA PROFILE B
Basophils Absolute: 0.1 10*3/uL (ref 0.0–0.2)
Basos: 1 %
EOS (ABSOLUTE): 0.4 10*3/uL (ref 0.0–0.4)
Eos: 6 %
Ferritin: 70 ng/mL (ref 15–150)
Folate: 9.7 ng/mL (ref >3.0)
Hematocrit: 40.4 % (ref 34.0–46.6)
Hemoglobin: 14.1 g/dL (ref 11.1–15.9)
Immature Grans (Abs): 0 10*3/uL (ref 0.0–0.1)
Immature Granulocytes: 0 %
Iron Saturation: 25 % (ref 15–55)
Iron: 95 ug/dL (ref 27–159)
Lymphocytes Absolute: 1.7 10*3/uL (ref 0.7–3.1)
Lymphs: 25 %
MCH: 33 pg (ref 26.6–33.0)
MCHC: 34.9 g/dL (ref 31.5–35.7)
MCV: 95 fL (ref 79–97)
Monocytes Absolute: 0.6 10*3/uL (ref 0.1–0.9)
Monocytes: 9 %
Neutrophils Absolute: 4.1 10*3/uL (ref 1.4–7.0)
Neutrophils: 59 %
Platelets: 227 10*3/uL (ref 150–450)
RBC: 4.27 x10E6/uL (ref 3.77–5.28)
RDW: 13.3 % (ref 11.7–15.4)
Retic Ct Pct: 3.5 % — ABNORMAL HIGH (ref 0.6–2.6)
Total Iron Binding Capacity: 376 ug/dL (ref 250–450)
UIBC: 281 ug/dL (ref 131–425)
Vitamin B-12: 343 pg/mL (ref 232–1245)
WBC: 6.9 10*3/uL (ref 3.4–10.8)

## 2022-03-11 ENCOUNTER — Ambulatory Visit (INDEPENDENT_AMBULATORY_CARE_PROVIDER_SITE_OTHER): Payer: 59 | Admitting: Family Medicine

## 2022-03-11 ENCOUNTER — Encounter: Payer: Self-pay | Admitting: Family Medicine

## 2022-03-11 VITALS — BP 136/84 | HR 77 | Temp 97.5°F | Wt 285.8 lb

## 2022-03-11 DIAGNOSIS — D219 Benign neoplasm of connective and other soft tissue, unspecified: Secondary | ICD-10-CM

## 2022-03-11 DIAGNOSIS — Z566 Other physical and mental strain related to work: Secondary | ICD-10-CM

## 2022-03-11 DIAGNOSIS — R1084 Generalized abdominal pain: Secondary | ICD-10-CM | POA: Diagnosis not present

## 2022-03-11 LAB — SURGICAL PATHOLOGY

## 2022-03-11 MED ORDER — OXYCODONE-ACETAMINOPHEN 5-325 MG PO TABS: 1.0000 | ORAL_TABLET | ORAL | 0 refills | Status: AC | PRN

## 2022-03-11 MED ORDER — OXYCODONE-ACETAMINOPHEN 5-325 MG PO TABS: 1.0000 | ORAL_TABLET | Freq: Four times a day (QID) | ORAL | 0 refills | Status: DC | PRN

## 2022-03-11 MED ORDER — OXYCODONE-ACETAMINOPHEN 5-325 MG PO TABS: 1.0000 | ORAL_TABLET | ORAL | 0 refills | Status: DC | PRN

## 2022-03-11 NOTE — Patient Instructions (Signed)
We are refilling oxycodone We gave you your paperwork, but will also fax a copy

## 2022-03-11 NOTE — Progress Notes (Signed)
   Megan [name] is a 36 y.o. female who presents today for an office visit.  Assessment/Plan:   Problem List Items Addressed This Visit       Other   Fibroid tumor    Fibroids causing abdominal pain, controlled on oxycodone 5 mg / 325 mg Bending or heavy lifting causes worsening abdominal pain and bleeding FMLA paperwork completed for work restrictions for 3 months Oxycodone refilled for 3 months      Relevant Medications   oxyCODONE-acetaminophen (PERCOCET/ROXICET) 5-325 MG tablet   oxyCODONE-acetaminophen (PERCOCET/ROXICET) 5-325 MG tablet   oxyCODONE-acetaminophen (PERCOCET/ROXICET) 5-325 MG tablet   Other physical and mental strain related to work - Primary   Other Visit Diagnoses     Abdominal pain, generalized       Relevant Medications   oxyCODONE-acetaminophen (PERCOCET/ROXICET) 5-325 MG tablet   oxyCODONE-acetaminophen (PERCOCET/ROXICET) 5-325 MG tablet   oxyCODONE-acetaminophen (PERCOCET/ROXICET) 5-325 MG tablet          Subjective:  HPI:  Megan [name] is a 36 y.o. female who has Menorrhagia with irregular cycle; Fibroid; Fibroid tumor; BMI 40.0-44.9, adult ([place]); and Other physical and mental strain related to work on their problem list..   She  has a past medical history of Drug dependence ([place]) and Tooth infection (01/28/2022)..   She presents with chief complaint of Follow-up (Patient needs form for work filled out. Patient would like to discuss on going bleeding since having depo.) .   Patient to follow-up for ongoing pain and FMLA associated with fibroids.  Patient reports that she still has ongoing heavy menstrual bleeding, however she was started on TXA by OB/GYN.  Patient had endometrial biopsy that was negative for cancer from the fibroids.  Patient has follow-up scheduled with gynecologic oncology to discuss hysterectomy.  Patient says recent CBC that was negative for anemia.       Objective:  Physical Exam: BP 136/84 (BP Location: Left  Arm, Patient Position: Sitting, Cuff Size: Large)   Pulse 77   Temp (!) 97.5 F (36.4 C) (Temporal)   Wt 285 lb 12.8 oz (129.6 kg)   LMP 02/02/2022   SpO2 98%   BMI 44.10 kg/m    General: No acute distress. Awake and conversant.  Eyes: Normal conjunctiva, anicteric. Round symmetric pupils.  ENT: Hearing grossly intact. No nasal discharge.  Neck: Neck is supple. No masses or thyromegaly.  Respiratory: Respirations are non-labored. No auditory wheezing.  Skin: Warm. No rashes or ulcers.  Psych: Alert and oriented. Cooperative, Appropriate mood and affect, Normal judgment.  CV: No cyanosis or JVD MSK: Normal ambulation. No clubbing  Neuro: Sensation and CN II-XII grossly normal.        Alesia Banda, MD, MS

## 2022-03-11 NOTE — Assessment & Plan Note (Signed)
Fibroids causing abdominal pain, controlled on oxycodone 5 mg / 325 mg Bending or heavy lifting causes worsening abdominal pain and bleeding FMLA paperwork completed for work restrictions for 3 months Oxycodone refilled for 3 months

## 2022-03-12 ENCOUNTER — Telehealth: Payer: Self-pay | Admitting: *Deleted

## 2022-03-12 NOTE — Telephone Encounter (Signed)
Spoke with the patient regarding the referral to GYN oncology. Patient scheduled a new patient with Dr Berline Lopes on 8/10 at 10:30 am. Patient given an arrival time of 10 am.  Explained to the patient the the doctor will perform a pelvic exam at this visit. Patient given the policy that no visitors under the 16 yrs are a loud in the [place]. Patient given the address/phone number for the clinic and that the center offers free valet service.

## 2022-03-16 LAB — CYTOLOGY - PAP
Adequacy: ABNORMAL
Chlamydia: NEGATIVE
Comment: NEGATIVE
Comment: NEGATIVE
Comment: NEGATIVE
Comment: NORMAL
High risk HPV: NEGATIVE
Neisseria Gonorrhea: NEGATIVE
Trichomonas: NEGATIVE

## 2022-03-17 ENCOUNTER — Telehealth: Payer: Self-pay | Admitting: Family Medicine

## 2022-03-17 NOTE — Telephone Encounter (Signed)
Advised patient of annotation below and she verbalized understanding.  

## 2022-03-17 NOTE — Telephone Encounter (Signed)
Pt would like to know if it is safe for her to donate blood. Please call.

## 2022-03-24 ENCOUNTER — Encounter: Payer: Self-pay | Admitting: Family Medicine

## 2022-03-31 ENCOUNTER — Encounter: Payer: Self-pay | Admitting: Family Medicine

## 2022-03-31 ENCOUNTER — Other Ambulatory Visit: Payer: Self-pay | Admitting: Family Medicine

## 2022-03-31 ENCOUNTER — Telehealth: Payer: Self-pay | Admitting: Family Medicine

## 2022-03-31 DIAGNOSIS — D219 Benign neoplasm of connective and other soft tissue, unspecified: Secondary | ICD-10-CM

## 2022-03-31 DIAGNOSIS — R1084 Generalized abdominal pain: Secondary | ICD-10-CM

## 2022-03-31 MED ORDER — OXYCODONE-ACETAMINOPHEN 5-325 MG PO TABS: 1.0000 | ORAL_TABLET | Freq: Four times a day (QID) | ORAL | 0 refills | Status: DC | PRN

## 2022-03-31 NOTE — Telephone Encounter (Signed)
error 

## 2022-04-07 ENCOUNTER — Encounter (INDEPENDENT_AMBULATORY_CARE_PROVIDER_SITE_OTHER): Payer: Self-pay

## 2022-04-08 ENCOUNTER — Encounter: Payer: Self-pay | Admitting: Gynecologic Oncology

## 2022-04-09 ENCOUNTER — Other Ambulatory Visit: Payer: Self-pay

## 2022-04-09 ENCOUNTER — Other Ambulatory Visit (HOSPITAL_COMMUNITY)
Admission: RE | Admit: 2022-04-09 | Discharge: 2022-04-09 | Disposition: A | Discharge status: Home / Self Care | Payer: Commercial Managed Care - HMO | Source: Ambulatory Visit

## 2022-04-09 ENCOUNTER — Inpatient Hospital Stay: Payer: Commercial Managed Care - HMO | Attending: Gynecologic Oncology | Admitting: Gynecologic Oncology

## 2022-04-09 ENCOUNTER — Inpatient Hospital Stay: Payer: Commercial Managed Care - HMO

## 2022-04-09 ENCOUNTER — Encounter: Payer: Self-pay | Admitting: Gynecologic Oncology

## 2022-04-09 VITALS — BP 134/75 | HR 80 | Temp 98.6°F | Resp 18 | Ht 68.5 in | Wt 287.0 lb

## 2022-04-09 DIAGNOSIS — R519 Headache, unspecified: Secondary | ICD-10-CM | POA: Diagnosis not present

## 2022-04-09 DIAGNOSIS — K5900 Constipation, unspecified: Secondary | ICD-10-CM | POA: Diagnosis not present

## 2022-04-09 DIAGNOSIS — R112 Nausea with vomiting, unspecified: Secondary | ICD-10-CM | POA: Insufficient documentation

## 2022-04-09 DIAGNOSIS — R6881 Early satiety: Secondary | ICD-10-CM | POA: Insufficient documentation

## 2022-04-09 DIAGNOSIS — D259 Leiomyoma of uterus, unspecified: Secondary | ICD-10-CM | POA: Insufficient documentation

## 2022-04-09 DIAGNOSIS — D219 Benign neoplasm of connective and other soft tissue, unspecified: Secondary | ICD-10-CM

## 2022-04-09 DIAGNOSIS — R197 Diarrhea, unspecified: Secondary | ICD-10-CM | POA: Diagnosis not present

## 2022-04-09 DIAGNOSIS — M1990 Unspecified osteoarthritis, unspecified site: Secondary | ICD-10-CM | POA: Insufficient documentation

## 2022-04-09 DIAGNOSIS — Z793 Long term (current) use of hormonal contraceptives: Secondary | ICD-10-CM | POA: Insufficient documentation

## 2022-04-09 DIAGNOSIS — Z124 Encounter for screening for malignant neoplasm of cervix: Secondary | ICD-10-CM | POA: Insufficient documentation

## 2022-04-09 DIAGNOSIS — Z6841 Body Mass Index (BMI) 40.0 and over, adult: Secondary | ICD-10-CM | POA: Diagnosis not present

## 2022-04-09 DIAGNOSIS — R109 Unspecified abdominal pain: Secondary | ICD-10-CM | POA: Diagnosis not present

## 2022-04-09 DIAGNOSIS — N921 Excessive and frequent menstruation with irregular cycle: Secondary | ICD-10-CM | POA: Insufficient documentation

## 2022-04-09 DIAGNOSIS — R1900 Intra-abdominal and pelvic swelling, mass and lump, unspecified site: Secondary | ICD-10-CM | POA: Diagnosis present

## 2022-04-09 DIAGNOSIS — N939 Abnormal uterine and vaginal bleeding, unspecified: Secondary | ICD-10-CM

## 2022-04-09 DIAGNOSIS — F1921 Other psychoactive substance dependence, in remission: Secondary | ICD-10-CM | POA: Diagnosis not present

## 2022-04-09 DIAGNOSIS — R350 Frequency of micturition: Secondary | ICD-10-CM | POA: Insufficient documentation

## 2022-04-09 LAB — CBC (CANCER CENTER ONLY)
HCT: 38.9 % (ref 36.0–46.0)
Hemoglobin: 13.8 g/dL (ref 12.0–15.0)
MCH: 33 pg (ref 26.0–34.0)
MCHC: 35.5 g/dL (ref 30.0–36.0)
MCV: 93.1 fL (ref 80.0–100.0)
Platelet Count: 272 10*3/uL (ref 150–400)
RBC: 4.18 MIL/uL (ref 3.87–5.11)
RDW: 13 % (ref 11.5–15.5)
WBC Count: 9.7 10*3/uL (ref 4.0–10.5)
nRBC: 0 % (ref 0.0–0.2)

## 2022-04-09 MED ORDER — MEDROXYPROGESTERONE ACETATE 10 MG PO TABS: 10.0000 mg | ORAL_TABLET | Freq: Every day | ORAL | 6 refills | Status: DC

## 2022-04-09 MED ORDER — IBUPROFEN 800 MG PO TABS: 800.0000 mg | ORAL_TABLET | Freq: Three times a day (TID) | ORAL | 0 refills | Status: DC | PRN

## 2022-04-09 MED ORDER — TRAMADOL HCL 50 MG PO TABS: 50.0000 mg | ORAL_TABLET | Freq: Four times a day (QID) | ORAL | 0 refills | Status: AC | PRN

## 2022-04-09 NOTE — Patient Instructions (Signed)
It was very nice to meet you today.  We will have a phone visit after I get your MRI results.  Depending on what this shows and concern for possible cancer, we will discuss need to think about surgery here (hysterectomy) versus having you see the minimally invasive gynecologic surgery group at [facility].  Because this referral can take some time to get scheduled, I am having my office placed the referral today so that we can start working on that.  Given the quantity of bleeding you have been having, we will check a CBC today.  There are many medications that may be able to help decrease or stop your bleeding.  I have sent a prescription for a progesterone medication called Provera to your pharmacy.    Please do not hesitate to call with any questions or concerns before our next visit.

## 2022-04-09 NOTE — Progress Notes (Signed)
GYNECOLOGIC ONCOLOGY NEW PATIENT CONSULTATION   Patient Name: Megan [name]  Patient Age: 36 y.o. Date of Service: 04/09/22 Referring Provider: Aletha Halim, MD  Primary Care Provider: Bonnita Hollow, MD Consulting Provider: Jeral Pinch, MD   Assessment/Plan:  Premenopausal patient with enlarged fibroid uterus, large abdominal mass suspected to be exophytic fibroid.  I discussed imaging findings with the patient and her sister. We reviewed CT images together. We reviewed the limitations of CT scan and findings on CT that suggest midline and right-sided abdominal mass is a pedunculated fibroid, likely with some degeneration. This, as well as its size, may explain the patient's pain and associated symptoms.   The patient has not had prior imaging, so how rapidly this mass has grown is difficult to determine. Her CT was in May. I have recommended abdominal and pelvic Mri (abdominal necessary given large size of her fibroid uterus to capture entire mass) to both assess for change in size over the last 3 months but also evaluate for any characteristics of the mass that might raise the concern for possible malignancy. We also discussed the possibility that this mass is adnexal in origin (although CT images would favor uterine origin). We will have a hone visit after MRI to discuss findings and next steps in planning.   The patient strongly desires attempt at uterine preservation. We discussed non-surgical options, such as medications (Lupron, Elagolix) and uterine artery embolization, that can cause shrinkage of fibroids as well as surgical options (myomectomy). If the patient wishes to pursue these options based on MRI results, I suggested referral to [facility].   We discussed recent endometrial biopsy showing inactive endometrium. Despite her heavy bleeding, CBC has been normal as recently as 1 month ago. I suggested repeating this today. We also discussed temporizing methods that may help  decrease her bleeding since she was not able to afford TXA. I sent a prescription for Provera (to be taken daily) to her pharmacy.   Pap test was collected today given recent unsatisfactory pap.   In the setting of possible future surgical intervention, we reviewed importance of working toward weight loss to help decrease surgical morbidity.   A copy of this note was sent to the patient's referring provider.   65 minutes of total time was spent for this patient encounter, including preparation, face-to-face counseling with the patient and coordination of care, and documentation of the encounter.   Jeral Pinch, MD  Division of Gynecologic Oncology  Department of Obstetrics and Gynecology  [facility]  ___________________________________________  Chief Complaint: Chief Complaint  Patient presents with   Fibroid tumor    History of Present Illness:  Megan [name] is a 36 y.o. y.o. female who is seen in consultation at the request of Dr. Ilda Basset for an evaluation of enlarged fibroid uterus.  Patient presented in July after noticing abnormal uterine bleeding and abdominal fullness in the past year that has worsened with time.  This was initially noticing that she felt something when she was laying on her belly.  With time, she noticed increasing abdominal girth and that her clothes were fitting differently.  She endorses a long history of heavy bleeding with frequent passage of clots during her menses; menses would typically be painful and last approximately 1 week.  Her menses were regular though, without any intermenstrual bleeding or spotting.  She received a Depo-Provera shot on 01/19/2022 to help with bleeding and was prescribed oral progesterone in May after she developed abnormal uterine  bleeding.  She describes this is daily bleeding with intermittent pain that comes and goes.  If she is stressed or more active, she will have heavier bleeding.  She wears  2 thick pads at a time and change them about every hour and a half, noting that they are saturated.  When she urinates, she will pass up to plum sized clots.  Patient was seen recently in the setting of her enlarged fibroid uterus and abnormal bleeding, was given prescription for Lysteda.  This was too expensive and patient has not been able to have it filled.  01/04/2022: CT of the abdomen and pelvis shows enlarged myomatous uterus with multiple fibroids.  Uterus measures approximately 23 cm in craniocaudal length with a large fundal fibroid extending into the abdomen and displacing bowel loops and causing some compression of the IVC.  The largest exophytic fundal fibroid measures approximately 21 x 15 x 20 cm.  No adenopathy, no free fluid, no evidence of carcinomatosis.  On 03/09/2022, she underwent endometrial biopsy, which showed minute strips of inactive endometrium mixed with blood and normal endocervical tissue.  Negative for hyperplasia or malignancy.  She endorses more than 6 months of abdominal pain, nausea, emesis, and headaches intermittently.  She describes the pain more recently in her upper abdomen, spanning across her belly as well as bilateral hip pain.  The upper abdominal pain started this week.  Despite intermittent nausea and emesis, she is able to eat and drink.  Endorses some early satiety.  Reports some decreased appetite.  Thinks that she has gained some weight.  Has intermittent episodes of constipation, diarrhea and normal bowel movements over the last 6 months.  Has some increased urinary frequency but denies having much urine comes out when she voids.  Patient presents with her sister today.  She has had 1 pregnancy loss.  She is very much wanting to have a child.  PAST MEDICAL HISTORY:  Past Medical History:  Diagnosis Date   Arthritis    Drug dependence ([place])    Tooth infection 01/28/2022     PAST SURGICAL HISTORY:  Past Surgical History:  Procedure Laterality Date    ENDOMETRIAL BIOPSY  03/09/2022   LIPOMA EXCISION  2009   on forehead    OB/GYN HISTORY:  OB History  Gravida Para Term Preterm AB Living  1       1    SAB IAB Ectopic Multiple Live Births  1            # Outcome Date GA Lbr Len/2nd Weight Sex Delivery Anes PTL Lv  1 SAB             Obstetric Comments  G1: SAB, date unknown but "years ago"    No LMP recorded.  Age at menarche: 89 Age at menopause: Not applicable Hx of HRT: Not applicable Hx of STDs: Yes Last pap: 2017.  Recent Pap smear was attempted but insufficient cellularity noted. History of abnormal pap smears: Denies  SCREENING STUDIES:  Last mammogram: Has never had  Last colonoscopy: Has never had Last bone mineral density: Has never had  MEDICATIONS: Outpatient Encounter Medications as of 04/09/2022  Medication Sig   ibuprofen (ADVIL) 800 MG tablet Take 1 tablet (800 mg total) by mouth every 8 (eight) hours as needed.   medroxyPROGESTERone (PROVERA) 10 MG tablet Take 1 tablet (10 mg total) by mouth daily.   ondansetron (ZOFRAN) 4 MG tablet Take 1 tablet (4 mg total) by mouth every 8 (eight) hours  as needed for nausea or vomiting.   traMADol (ULTRAM) 50 MG tablet Take 1 tablet (50 mg total) by mouth every 6 (six) hours as needed for up to 20 days.   tranexamic acid (LYSTEDA) 650 MG TABS tablet Take 1,300 mg by mouth 3 (three) times daily.   oxyCODONE-acetaminophen (PERCOCET/ROXICET) 5-325 MG tablet Take 1 tablet by mouth every 4 (four) hours as needed for severe pain. (Patient not taking: Reported on 04/08/2022)   [DISCONTINUED] oxyCODONE-acetaminophen (PERCOCET/ROXICET) 5-325 MG tablet Take 1 tablet by mouth every 4 (four) hours as needed for severe pain.   [DISCONTINUED] oxyCODONE-acetaminophen (PERCOCET/ROXICET) 5-325 MG tablet Take 1 tablet by mouth every 6 (six) hours as needed for severe pain.   [DISCONTINUED] tranexamic acid (LYSTEDA) 650 MG TABS tablet Take 2 tablets (1,300 mg total) by mouth 3 (three)  times daily. Take during menses for a maximum of five days   No facility-administered encounter medications on file as of 04/09/2022.    ALLERGIES:  Allergies  Allergen Reactions   Penicillins Other (See Comments)    Unknown since childhood  Pt states she has had penicillin with no complications     FAMILY HISTORY:  Family History  Problem Relation Age of Onset   Bone cancer Maternal Aunt    Colon cancer Neg Hx    Breast cancer Neg Hx    Ovarian cancer Neg Hx    Endometrial cancer Neg Hx    Pancreatic cancer Neg Hx    Prostate cancer Neg Hx      SOCIAL HISTORY:  Social Connections: Not on file    REVIEW OF SYSTEMS:  Denies appetite changes, fevers, chills, fatigue, unexplained weight changes. Denies hearing loss, neck lumps or masses, mouth sores, ringing in ears or voice changes. Denies cough or wheezing.  Denies shortness of breath. Denies chest pain or palpitations. Denies leg swelling. Denies abdominal distention, pain, blood in stools, constipation, diarrhea, nausea, vomiting, or early satiety. Denies pain with intercourse, dysuria, frequency, hematuria or incontinence. Denies hot flashes, pelvic pain, vaginal bleeding or vaginal discharge.   Denies joint pain, back pain or muscle pain/cramps. Denies itching, rash, or wounds. Denies dizziness, headaches, numbness or seizures. Denies swollen lymph nodes or glands, denies easy bruising or bleeding. Denies anxiety, depression, confusion, or decreased concentration.  Physical Exam:  Vital Signs for this encounter:  Blood pressure 134/75, pulse 80, temperature 98.6 F (37 C), temperature source Oral, resp. rate 18, height 5' 8.5" (1.74 m), weight 287 lb (130.2 kg), SpO2 99 %. Body mass index is 43 kg/m. General: Alert, oriented, no acute distress.  HEENT: Normocephalic, atraumatic. Sclera anicteric.  Chest: Clear to auscultation bilaterally. No wheezes, rhonchi, or rales. Cardiovascular: Regular rate and rhythm, no  murmurs, rubs, or gallops.  Abdomen: Obese. Normoactive bowel sounds. Enlarged mass within the mid and upper abdomen, palpated approximately 8 cm above the umbilicus, firm.  Minimal mobility due to its size. No palpable fluid wave.  Extremities: Grossly normal range of motion. Warm, well perfused. No edema bilaterally.  Skin: No rashes or lesions.  Lymphatics: No cervical, supraclavicular, or inguinal adenopathy.  GU:  Normal external female genitalia.  No lesions. No discharge or bleeding.             Bladder/urethra:  No lesions or masses, well supported bladder             Vagina: Well-rugated, no lesions or masses.  Some older appearing blood within the vaginal vault.  Cervix: Normal appearing, no lesions.  Blood removed from the cervix using cotton swabs.  Pap test and HPV testing collected.             Uterus: Minimal mobility due to size and weight.  Broader lower uterine segment.             Adnexa: Massive mid abdominal mass that moves minimally in conjunction with the uterus, best appreciated on abdominal exam.  Rectal: Deferred.  LABORATORY AND RADIOLOGIC DATA:  Outside medical records were reviewed to synthesize the above history, along with the history and physical obtained during the visit.   Lab Results  Component Value Date   WBC 9.7 04/09/2022   HGB 13.8 04/09/2022   HCT 38.9 04/09/2022   PLT 272 04/09/2022   GLUCOSE 84 01/04/2022   ALT 20 01/04/2022   AST 26 01/04/2022   NA 140 01/04/2022   K 3.9 01/04/2022   CL 107 01/04/2022   CREATININE 0.92 01/04/2022   BUN 9 01/04/2022   CO2 24 01/04/2022   TSH 1.91 01/01/2022   INR 0.9 01/01/2022

## 2022-04-10 ENCOUNTER — Telehealth: Payer: Self-pay | Admitting: *Deleted

## 2022-04-10 NOTE — Telephone Encounter (Signed)
Per Dr Berline Lopes fax records to [facility]

## 2022-04-13 ENCOUNTER — Telehealth: Payer: Self-pay | Admitting: Family Medicine

## 2022-04-13 ENCOUNTER — Encounter: Payer: Self-pay | Admitting: Family Medicine

## 2022-04-13 ENCOUNTER — Ambulatory Visit (INDEPENDENT_AMBULATORY_CARE_PROVIDER_SITE_OTHER): Payer: Commercial Managed Care - HMO | Admitting: Family Medicine

## 2022-04-13 VITALS — BP 136/84 | HR 90 | Temp 97.5°F | Wt 279.0 lb

## 2022-04-13 DIAGNOSIS — D219 Benign neoplasm of connective and other soft tissue, unspecified: Secondary | ICD-10-CM

## 2022-04-13 DIAGNOSIS — L309 Dermatitis, unspecified: Secondary | ICD-10-CM

## 2022-04-13 DIAGNOSIS — F4323 Adjustment disorder with mixed anxiety and depressed mood: Secondary | ICD-10-CM

## 2022-04-13 MED ORDER — SERTRALINE HCL 25 MG PO TABS: 25.0000 mg | ORAL_TABLET | Freq: Every day | ORAL | 3 refills | Status: DC

## 2022-04-13 MED ORDER — OXYCODONE-ACETAMINOPHEN 10-325 MG PO TABS: 1.0000 | ORAL_TABLET | ORAL | 0 refills | Status: AC | PRN

## 2022-04-13 MED ORDER — TRIAMCINOLONE ACETONIDE 0.1 % EX CREA: 1.0000 | TOPICAL_CREAM | Freq: Two times a day (BID) | CUTANEOUS | 0 refills | Status: DC

## 2022-04-13 NOTE — Assessment & Plan Note (Signed)
Facial Refilled triamcinolone ointment 0.1% Did discuss risk associated using triamcinolone, we will try sparingly at this moment, can consider steroid sparing alternative such as Protopic

## 2022-04-13 NOTE — Progress Notes (Signed)
Assessment/Plan:   Problem List Items Addressed This Visit       Musculoskeletal and Integument   Eczema    Facial Refilled triamcinolone ointment 0.1% Did discuss risk associated using triamcinolone, we will try sparingly at this moment, can consider steroid sparing alternative such as Protopic      Relevant Medications   triamcinolone cream (KENALOG) 0.1 %     Other   Fibroid tumor - Primary    Fibroids causing abdominal pain, pain worsening Bending or heavy lifting causes worsening abdominal pain and bleeding Has seen, ob/gyn, gyn onc, and has upcoming MRI, as well as follow up with [facility] Discussed that I can increase oxycodone to 10/325 mg, but do not feel comfortable going up any further, refilled for 3 months, but after this, will need to be referred to pain management      Relevant Medications   oxyCODONE-acetaminophen (PERCOCET) 10-325 MG tablet   oxyCODONE-acetaminophen (PERCOCET) 10-325 MG tablet   oxyCODONE-acetaminophen (PERCOCET) 10-325 MG tablet   Adjustment disorder with mixed anxiety and depressed mood    Multiple lifestyle changes including chronic pain associated with fibroids, and recent relationship changes Trial Zoloft 25 mg nightly Follow-up 1 month      Relevant Medications   sertraline (ZOLOFT) 25 MG tablet       Subjective:  HPI:  Megan [name] is a 36 y.o. female who has Menorrhagia with irregular cycle; Fibroid; Fibroid tumor; BMI 40.0-44.9, adult ([place]); Other physical and mental strain related to work; Eczema; and Adjustment disorder with mixed anxiety and depressed mood on their problem list..   She  has a past medical history of Arthritis, Drug dependence ([place]), and Tooth infection (01/28/2022)..   She presents with chief complaint of Follow-up (Would like to discuss triamcinolone cream for eczema. She states that her pain has changed and is worse. She has a referral to [facility] for surgery. Patient would like to discuss filling out  paperwork. ) .   Eczema: Patient complains of rash. Onset of symptoms several years ago, and have been unchanged since that time.  Risk factors include none. Treatment modalities that have been used in the past include: triamcinolone 0.1% ointment. No imporvement with hydrocortisone.   .  Fibroids.  Patient developed have exophytic fibroids.  CT done 12/2021, has seen gynecology in gynecological oncology at [facility].  Per gynecological oncology note, some of the imaging findings may be associated with fibroid degradation, and questionable necrosis.  MRI abdominal and pelvis pending.  Referral to [facility] has also been placed for advanced treatment.  Patient reports ongoing abdominal pain.  This has been increasing.  Patient requiring oxycodone 10 mg / 325 mg every 4 hours as needed, but fairly regularly.  Patient has ongoing vaginal bleeding.  CBC has been stable about 1 month ago with hemoglobin in the 13's.  Vaginal bleeding has been treated with progesterone and TXA.  Patient's had incomplete control even with these.  Patient reports that she is unable to do her work from pain.  She is needing FMLA.  Patient reports ongoing anxiety associate with pain.  Also reports that she recently broke up with her boyfriend. Denies HI/SI. She has been treated for anxiety in the past w/ zoloft and klonipin.     Past Surgical History:  Procedure Laterality Date   ENDOMETRIAL BIOPSY  03/09/2022   LIPOMA EXCISION  2009   on forehead    Outpatient Medications Prior to Visit  Medication Sig Dispense Refill   medroxyPROGESTERone (PROVERA) 10  MG tablet Take 1 tablet (10 mg total) by mouth daily. 30 tablet 6   ondansetron (ZOFRAN) 4 MG tablet Take 1 tablet (4 mg total) by mouth every 8 (eight) hours as needed for nausea or vomiting. 20 tablet 0   traMADol (ULTRAM) 50 MG tablet Take 1 tablet (50 mg total) by mouth every 6 (six) hours as needed for up to 20 days. 20 tablet 0   ibuprofen (ADVIL) 800 MG tablet Take 1 tablet  (800 mg total) by mouth every 8 (eight) hours as needed. 30 tablet 0   tranexamic acid (LYSTEDA) 650 MG TABS tablet Take 1,300 mg by mouth 3 (three) times daily. (Patient not taking: Reported on 04/13/2022)     No facility-administered medications prior to visit.    Family History  Problem Relation Age of Onset   Bone cancer Maternal Aunt    Colon cancer Neg Hx    Breast cancer Neg Hx    Ovarian cancer Neg Hx    Endometrial cancer Neg Hx    Pancreatic cancer Neg Hx    Prostate cancer Neg Hx     Social History   Socioeconomic History   Marital status: Single    Spouse name: Not on file   Number of children: Not on file   Years of education: Not on file   Highest education level: Not on file  Occupational History   Occupation: on leave from work  Tobacco Use   Smoking status: Every Day    Packs/day: 0.50    Years: 18.00    Total pack years: 9.00    Types: Cigarettes   Smokeless tobacco: Never  Vaping Use   Vaping Use: Never used  Substance and Sexual Activity   Alcohol use: Yes    Alcohol/week: 4.0 standard drinks of alcohol    Types: 4 Cans of beer per week    Comment: weekly   Drug use: Yes   Sexual activity: Not Currently  Other Topics Concern   Not on file  Social History Narrative   Not on file   Social Determinants of Health   Financial Resource Strain: Not on file  Food Insecurity: Not on file  Transportation Needs: Not on file  Physical Activity: Not on file  Stress: Not on file  Social Connections: Not on file  Intimate Partner Violence: Not on file                                                                                                 Objective:  Physical Exam: BP 136/84 (BP Location: Left Arm, Patient Position: Sitting, Cuff Size: Large)   Pulse 90   Temp (!) 97.5 F (36.4 C) (Temporal)   Wt 279 lb (126.6 kg)   LMP  (LMP Unknown)   SpO2 96%   BMI 41.80 kg/m    General: No acute distress. Awake and conversant.  Eyes: Normal  conjunctiva, anicteric. Round symmetric pupils.  ENT: Hearing grossly intact. No nasal discharge.  Neck: Neck is supple. No masses or thyromegaly.  Respiratory: Respirations are non-labored. No auditory wheezing.  Skin: Warm. Mild scaling  of face  ABD: tender to palpation in all quads Psych: Alert and oriented. Cooperative, Appropriate mood and affect, Normal judgment.  CV: No cyanosis or JVD MSK: Normal ambulation. No clubbing  Neuro: Sensation and CN II-XII grossly normal.        Alesia Banda, MD, MS

## 2022-04-13 NOTE — Telephone Encounter (Signed)
Caller Name: Megan [name] Call back phone #: [phone]  Reason for Call: Pt dropped off forms that she needs to get signed by Dr. Grandville Silos. I have left these forms in his folder up front

## 2022-04-13 NOTE — Assessment & Plan Note (Signed)
Multiple lifestyle changes including chronic pain associated with fibroids, and recent relationship changes Trial Zoloft 25 mg nightly Follow-up 1 month

## 2022-04-13 NOTE — Patient Instructions (Signed)
We are increasing oxycodone to 10/325 mg 4 times a day as needed for pain. Follow up in 3 months for pain.  We are starting zoloft at 25 mg for anxiety and depression. Follow up in 1 months, virtual We are prescribing triamcinolone ointment for eczema

## 2022-04-13 NOTE — Assessment & Plan Note (Signed)
Fibroids causing abdominal pain, pain worsening Bending or heavy lifting causes worsening abdominal pain and bleeding Has seen, ob/gyn, gyn onc, and has upcoming MRI, as well as follow up with [facility] Discussed that I can increase oxycodone to 10/325 mg, but do not feel comfortable going up any further, refilled for 3 months, but after this, will need to be referred to pain management

## 2022-04-14 ENCOUNTER — Telehealth: Payer: Self-pay | Admitting: *Deleted

## 2022-04-14 LAB — CYTOLOGY - PAP
Comment: NEGATIVE
Diagnosis: NEGATIVE
Diagnosis: REACTIVE
High risk HPV: NEGATIVE

## 2022-04-14 NOTE — Telephone Encounter (Signed)
Spoke with the patient and explained that her insurance will only allow her MRI scans to be at [place].   Spoke with [facility] and they don't accept [employer]. Spoke with the patient and explained that [place] doesn't accept [employer]. Patient sated that she also has [employer]. Explained that our office would update the authorization department and call her back.

## 2022-04-14 NOTE — Telephone Encounter (Signed)
Form has been faxed to [facility] this morning. Waiting on confirmation

## 2022-04-14 NOTE — Telephone Encounter (Signed)
Per authorization department, patient's insurance with [facility]ella Scheuermann still wants images done at [place]. Appts scheduled at [facility] and patient called with appt dates/times/instructions.

## 2022-04-15 NOTE — Telephone Encounter (Signed)
Spoke with patient and advised her that I have tried faxing her forms over to [facility] for the past 24 hours with no confirmation. She advised me that she can email the copy and will pick up form. I made a copy for her and placed it up front for pick up and one to be scanned into her chart.

## 2022-04-16 ENCOUNTER — Encounter (HOSPITAL_COMMUNITY): Payer: Self-pay

## 2022-04-16 ENCOUNTER — Ambulatory Visit (HOSPITAL_COMMUNITY): Payer: Commercial Managed Care - HMO

## 2022-04-16 ENCOUNTER — Ambulatory Visit: Payer: 59 | Admitting: Family Medicine

## 2022-04-16 ENCOUNTER — Ambulatory Visit (HOSPITAL_COMMUNITY): Admission: RE | Admit: 2022-04-16 | Payer: Commercial Managed Care - HMO | Source: Ambulatory Visit

## 2022-04-20 ENCOUNTER — Inpatient Hospital Stay: Payer: Commercial Managed Care - HMO | Admitting: Gynecologic Oncology

## 2022-04-23 ENCOUNTER — Telehealth: Payer: Self-pay | Admitting: *Deleted

## 2022-04-23 NOTE — Telephone Encounter (Signed)
Patient scheduled at [facility] on 10/30

## 2022-04-24 ENCOUNTER — Ambulatory Visit
Admission: RE | Admit: 2022-04-24 | Discharge: 2022-04-24 | Disposition: A | Discharge status: Home / Self Care | Payer: Commercial Managed Care - HMO | Source: Ambulatory Visit | Attending: Gynecologic Oncology | Admitting: Gynecologic Oncology

## 2022-04-24 DIAGNOSIS — D219 Benign neoplasm of connective and other soft tissue, unspecified: Secondary | ICD-10-CM

## 2022-04-24 DIAGNOSIS — R1900 Intra-abdominal and pelvic swelling, mass and lump, unspecified site: Secondary | ICD-10-CM

## 2022-04-24 MED ORDER — GADOBENATE DIMEGLUMINE 529 MG/ML IV SOLN
20.0000 mL | Freq: Once | INTRAVENOUS | Status: AC | PRN
  Administered 2022-04-24: 20 mL via INTRAVENOUS

## 2022-04-29 ENCOUNTER — Inpatient Hospital Stay: Payer: Commercial Managed Care - HMO | Admitting: Gynecologic Oncology

## 2022-04-29 ENCOUNTER — Telehealth: Payer: Self-pay | Admitting: Gynecologic Oncology

## 2022-04-29 ENCOUNTER — Encounter: Payer: Self-pay | Admitting: Gynecologic Oncology

## 2022-04-29 NOTE — Telephone Encounter (Signed)
Called the patient at our scheduled phone visit time as well as several times after.  The number that we have on file for her is not working.  I sent her a patient message asking her to call the office tomorrow to give a number so that I can contact her tomorrow.  Jeral Pinch MD Gynecologic Oncology

## 2022-04-30 ENCOUNTER — Telehealth: Payer: Self-pay | Admitting: Gynecologic Oncology

## 2022-04-30 ENCOUNTER — Telehealth: Payer: Self-pay

## 2022-04-30 ENCOUNTER — Encounter: Payer: Self-pay | Admitting: Gynecologic Oncology

## 2022-04-30 NOTE — Telephone Encounter (Signed)
Pt called after hours triage line stating a new contact number for her is [phone]. Chart has been updated.

## 2022-04-30 NOTE — Telephone Encounter (Signed)
Attempted to return the patient's call.  Called her at the number that she left.  This is her family members number.  I was told that her cell phone was back on.  I tried her cell phone.  This went directly to voicemail and her voice mailbox was full.  I sent the patient a message asking her to call the clinic tomorrow.  Jeral Pinch MD Gynecologic Oncology

## 2022-05-01 ENCOUNTER — Telehealth: Payer: Self-pay | Admitting: Gynecologic Oncology

## 2022-05-01 NOTE — Telephone Encounter (Signed)
Was able to reach the patient.  Discussed MRI findings.  There has likely been some small amount of growth in the mass since her CT scan.  There are cystic areas suggesting degeneration.  While I think overall this likely represents a large fibroid, given its appearance and growth, I raise the concern for possible malignancy.  I offered that we could proceed with scheduling surgery here with plan for attempt at myomectomy if this seems safe and feasible although likely proceeding with total hysterectomy and bilateral salpingectomy (plan would be to save the ovaries).  If the patient decides that she strongly wishes to preserve her uterus and fertility, then I would recommend that she at least meet with [facility] for consideration of surgery with them.  The patient has family with her and would like to spend some time thinking about this decision and speaking with them.  I have asked her to call at the beginning of next week to let me know how she like to proceed.  Jeral Pinch MD Gynecologic Oncology

## 2022-05-06 ENCOUNTER — Telehealth: Payer: Self-pay

## 2022-05-06 NOTE — Telephone Encounter (Signed)
Pt called today stating she would like to go ahead with surgery. She would like it the week of September 18 if at all possible.   Pt aware I will notify Dr.Tucker and Joylene John NP and she would here from our office on details. She voiced an understanding.

## 2022-05-07 ENCOUNTER — Encounter: Payer: Self-pay | Admitting: Gynecologic Oncology

## 2022-05-08 ENCOUNTER — Telehealth: Payer: Self-pay | Admitting: *Deleted

## 2022-05-08 NOTE — Telephone Encounter (Addendum)
Patient called and stated "I am to have surgery soon with Dr Berline Lopes. I need to the recovery time for surgery; since I really have no one to help me. What is the recovery time with a myomectomy verus a total hysterectomy." Explained that the message would be given to [facility] APP and the office would call her back

## 2022-05-08 NOTE — Telephone Encounter (Signed)
Called the patient back ans explained per [facility] APP " the restrictions for both surgeries would be no lifting, pushing, pulling or straining over 10 lbs for 6 weeks. Also no driving for 1-2 weeks based off surgery and open vs robot."    Patient stated "Thanks for the information. I just saw Melissa's note and answered her. I will go with the myomectomy and 9/27 is good."    Patient given the fax number for FMLA/disability papers

## 2022-05-11 ENCOUNTER — Encounter: Payer: Self-pay | Admitting: Gynecologic Oncology

## 2022-05-12 ENCOUNTER — Telehealth: Payer: Self-pay

## 2022-05-12 ENCOUNTER — Other Ambulatory Visit: Payer: Self-pay | Admitting: Family Medicine

## 2022-05-12 ENCOUNTER — Encounter: Payer: Self-pay | Admitting: Family Medicine

## 2022-05-12 DIAGNOSIS — R1084 Generalized abdominal pain: Secondary | ICD-10-CM

## 2022-05-12 DIAGNOSIS — D219 Benign neoplasm of connective and other soft tissue, unspecified: Secondary | ICD-10-CM

## 2022-05-12 DIAGNOSIS — L309 Dermatitis, unspecified: Secondary | ICD-10-CM

## 2022-05-12 MED ORDER — ONDANSETRON 4 MG PO TBDP: 4.0000 mg | ORAL_TABLET | Freq: Three times a day (TID) | ORAL | 0 refills | Status: DC | PRN

## 2022-05-12 MED ORDER — TRIAMCINOLONE ACETONIDE 0.1 % EX CREA: 1.0000 | TOPICAL_CREAM | Freq: Two times a day (BID) | CUTANEOUS | 0 refills | Status: DC

## 2022-05-12 NOTE — Telephone Encounter (Signed)
Fyi, I Contacted patient and advised her of annotation below from Dr. Grandville Silos. She stated that she understood. Stated she would talk to him more in depth during her appointment on 05/14/2022.

## 2022-05-14 ENCOUNTER — Telehealth (INDEPENDENT_AMBULATORY_CARE_PROVIDER_SITE_OTHER): Payer: Commercial Managed Care - HMO | Admitting: Family Medicine

## 2022-05-14 DIAGNOSIS — F4323 Adjustment disorder with mixed anxiety and depressed mood: Secondary | ICD-10-CM | POA: Diagnosis not present

## 2022-05-14 DIAGNOSIS — N898 Other specified noninflammatory disorders of vagina: Secondary | ICD-10-CM | POA: Diagnosis not present

## 2022-05-14 MED ORDER — SERTRALINE HCL 25 MG PO TABS: 12.5000 mg | ORAL_TABLET | Freq: Every day | ORAL | 2 refills | Status: DC

## 2022-05-14 NOTE — Progress Notes (Signed)
Virtual Visit via Video Note  I connected with Megan [name] on 05/14/22 at  9:20 AM EDT by a video enabled telemedicine application and verified that I am speaking with the correct person using two identifiers.  Location: Patient: home Provider: Office   I discussed the limitations of evaluation and management by telemedicine and the availability of in person appointments. The patient expressed understanding and agreed to proceed.  History of Present Illness:  Depression/Anxiety, established problem,  Current Medications: zoloft 25 mg Side Effects: makes her more anxious and feel "funny" if takes the 25 mg, but if she takes the 1/2 tablet, no Side Effects Current Symptoms/Interim History:      05/14/2022    9:05 AM  Depression screen PHQ 2/9  Decreased Interest 1  Down, Depressed, Hopeless 2  PHQ - 2 Score 3  Altered sleeping 3  Tired, decreased energy 3  Change in appetite 3  Feeling bad or failure about yourself  1  Trouble concentrating 1  Moving slowly or fidgety/restless 0  Suicidal thoughts 0  PHQ-9 Score 14  Difficult doing work/chores Somewhat difficult       05/14/2022    9:06 AM  GAD 7 : Generalized Anxiety Score  Nervous, Anxious, on Edge 2  Control/stop worrying 2  Worry too much - different things 3  Trouble relaxing 3  Restless 1  Easily annoyed or irritable 3  Afraid - awful might happen 2  Total GAD 7 Score 16  Anxiety Difficulty Somewhat difficult    ROS: No SI or HI.   Vaginitis: Patient complains of an abnormal vaginal discharge for a few days. Vaginal symptoms include odor.Vulvar symptoms include none Discharge described as: bloody, mild, improved on provera.Other associated symptoms: none.Menstrual pattern: She had been bleeding irregularly due to fibroids.     Observations/Objective: Gen: NAD, resting comfortably HEENT: EOMI Pulm: NWOB Skin: no rash on face fixed  Neuro: no facial asymmetry or dysmetria Psych: Normal  affect  Assessment and Plan:  Problem List Items Addressed This Visit       Other   Adjustment disorder with mixed anxiety and depressed mood    Mildly improved on low-dose sertraline 12.5 mg Refilled Follow-up 3 months      Relevant Medications   sertraline (ZOLOFT) 25 MG tablet   Vaginal discharge - Primary    Discussed condition and treatment of the virtual visit Possibly BV versus yeast Ordered vaginal swab Can trial OTC Vagisil or other treatments      Relevant Orders   Cervicovaginal ancillary only( [place])    Follow Up Instructions:    I discussed the assessment and treatment plan with the patient. The patient was provided an opportunity to ask questions and all were answered. The patient agreed with the plan and demonstrated an understanding of the instructions.   The patient was advised to call back or seek an in-person evaluation if the symptoms worsen or if the condition fails to improve as anticipated.  I provided 15 minutes of non-face-to-face time during this encounter.   Bonnita Hollow, MD

## 2022-05-14 NOTE — Assessment & Plan Note (Signed)
Mildly improved on low-dose sertraline 12.5 mg Refilled Follow-up 3 months

## 2022-05-14 NOTE — Assessment & Plan Note (Signed)
Discussed condition and treatment of the virtual visit Possibly BV versus yeast Ordered vaginal swab Can trial OTC Vagisil or other treatments

## 2022-05-15 ENCOUNTER — Telehealth: Payer: Self-pay | Admitting: *Deleted

## 2022-05-15 NOTE — Telephone Encounter (Signed)
called [facility] 10/30 is the first appt. They are booked out to the end of November. She is on a waiting list

## 2022-06-05 ENCOUNTER — Other Ambulatory Visit: Payer: Self-pay | Admitting: Gynecologic Oncology

## 2022-06-05 DIAGNOSIS — R1030 Lower abdominal pain, unspecified: Secondary | ICD-10-CM

## 2022-06-05 MED ORDER — IBUPROFEN 600 MG PO TABS: 600.0000 mg | ORAL_TABLET | Freq: Three times a day (TID) | ORAL | 1 refills | Status: DC | PRN

## 2022-06-05 MED ORDER — TRAMADOL HCL 50 MG PO TABS: 50.0000 mg | ORAL_TABLET | Freq: Four times a day (QID) | ORAL | 0 refills | Status: DC | PRN

## 2022-06-05 NOTE — Progress Notes (Signed)
See mychart message about request for refill on medications related to abdominal pain from a large abdominal mass. Per Dr. Berline Lopes, we can prescribe the requested medications until her appt with [facility] at the end of October. Patient message sent about the use of ibup and zoloft.

## 2022-06-23 ENCOUNTER — Other Ambulatory Visit: Payer: Self-pay | Admitting: Gynecologic Oncology

## 2022-06-23 DIAGNOSIS — R1030 Lower abdominal pain, unspecified: Secondary | ICD-10-CM

## 2022-06-24 ENCOUNTER — Telehealth: Payer: Self-pay

## 2022-06-24 MED ORDER — TRAMADOL HCL 50 MG PO TABS: 50.0000 mg | ORAL_TABLET | Freq: Four times a day (QID) | ORAL | 0 refills | Status: DC | PRN

## 2022-06-24 NOTE — Telephone Encounter (Signed)
MSG from [facility], I am getting a refill request on tramadol for this patient. Please confirm she still has an appt with [facility] on Oct 30. How often is she using the tramadol? Taking ibup as well?   Per Joylene John NP, I connected with patient regarding pharmacy sending a request for Tramadol.   Pt states she does have an appointment with [facility] on 10/30, she confirmed the appointment today. She states she uses the Oxycodone, given by Dr. Grandville Silos as needed and if pain is not relieved within 2 hours she will take an Ibuprofen and Tramadol together.   She is aware I will notify Melissa of this information and also aware Tramadol has been sent in to her pharmacy

## 2022-06-29 ENCOUNTER — Telehealth: Payer: Self-pay

## 2022-06-29 NOTE — Telephone Encounter (Signed)
Pt called office today stating she is currently at her appointment with [facility]. The financial person there told her they do not accept her [employer]. She states she told them she would have to cancel her appointment because she can not afford the whole bill.  Advised her to call her insurance company to see who is in network. She states she will do that.  Joylene John NP notified.   Pt aware I will call [facility] and check on this.

## 2022-06-30 NOTE — Telephone Encounter (Signed)
I spoke to [place] at [facility]. She states they accept [employer], however, they do not accept the type of plan she has, [employer] connect.   I connected with [facility] and left a voicemail for them to call back. Phone Number [phone]  Pt is aware of above information.

## 2022-07-01 NOTE — Telephone Encounter (Signed)
Per Lorriane Shire at [facility], she states they take pt's current insurance,[employer] HMO, however, it depends on what particular provider pt is scheduled with. Lorriane Shire states once they receive the referral, their office checks with the insurance to make sure their provider is in network by talking to the financial counselor. They will call the patient to schedule.   LVM for pt to call back so I could update her on new info.

## 2022-07-01 NOTE — Telephone Encounter (Signed)
LVM for [facility] to call back regarding referring Megan [name] and wether they take her insurance

## 2022-07-02 ENCOUNTER — Ambulatory Visit (INDEPENDENT_AMBULATORY_CARE_PROVIDER_SITE_OTHER): Payer: BLUE CROSS/BLUE SHIELD | Admitting: Family Medicine

## 2022-07-02 ENCOUNTER — Other Ambulatory Visit: Payer: Self-pay | Admitting: Gynecologic Oncology

## 2022-07-02 ENCOUNTER — Encounter: Payer: Self-pay | Admitting: Family Medicine

## 2022-07-02 VITALS — BP 128/84 | HR 77 | Temp 97.5°F | Wt 271.6 lb

## 2022-07-02 DIAGNOSIS — R1030 Lower abdominal pain, unspecified: Secondary | ICD-10-CM

## 2022-07-02 DIAGNOSIS — K047 Periapical abscess without sinus: Secondary | ICD-10-CM | POA: Diagnosis not present

## 2022-07-02 DIAGNOSIS — R1084 Generalized abdominal pain: Secondary | ICD-10-CM

## 2022-07-02 DIAGNOSIS — Z599 Problem related to housing and economic circumstances, unspecified: Secondary | ICD-10-CM

## 2022-07-02 DIAGNOSIS — D219 Benign neoplasm of connective and other soft tissue, unspecified: Secondary | ICD-10-CM

## 2022-07-02 DIAGNOSIS — L309 Dermatitis, unspecified: Secondary | ICD-10-CM

## 2022-07-02 MED ORDER — CLINDAMYCIN HCL 300 MG PO CAPS: 300.0000 mg | ORAL_CAPSULE | Freq: Three times a day (TID) | ORAL | 0 refills | Status: AC

## 2022-07-02 MED ORDER — TRIAMCINOLONE ACETONIDE 0.1 % EX CREA: 1.0000 | TOPICAL_CREAM | Freq: Two times a day (BID) | CUTANEOUS | 0 refills | Status: DC

## 2022-07-02 MED ORDER — METHOCARBAMOL 750 MG PO TABS: 750.0000 mg | ORAL_TABLET | Freq: Three times a day (TID) | ORAL | 0 refills | Status: DC | PRN

## 2022-07-02 MED ORDER — TRAMADOL HCL 50 MG PO TABS: 50.0000 mg | ORAL_TABLET | Freq: Three times a day (TID) | ORAL | 0 refills | Status: DC | PRN

## 2022-07-02 NOTE — Telephone Encounter (Signed)
[facility] referral faxed and confirmed to [phone].  Pt is aware of referral being sent.  She did mention she saw her PCP today and gave him an update of what is going on

## 2022-07-02 NOTE — Patient Instructions (Addendum)
For pain, we are refilling tramadol for 1 month and added robaxin. We are referring to pain management. You will need to establish with them for ongoing pain needs.   For dental infection, we are prescribing clindamycin.

## 2022-07-02 NOTE — Progress Notes (Signed)
Assessment/Plan:   Problem List Items Addressed This Visit       Musculoskeletal and Integument   Eczema   Relevant Medications   triamcinolone cream (KENALOG) 0.1 %     Other   Fibroid tumor - Primary   Relevant Orders   Ambulatory referral to Pain Clinic   Other Visit Diagnoses     Abdominal pain, generalized       Relevant Medications   methocarbamol (ROBAXIN-750) 750 MG tablet   Other Relevant Orders   Ambulatory referral to Pain Clinic   Lower abdominal pain       Relevant Medications   traMADol (ULTRAM) 50 MG tablet   methocarbamol (ROBAXIN-750) 750 MG tablet   Other Relevant Orders   Ambulatory referral to Pain Clinic   Tooth infection       Relevant Medications   clindamycin (CLEOCIN) 300 MG capsule   Financial difficulties          Regarding abdominal pain, did reemphasize that pain management was a temporary solution to patient's chronic ongoing problem since that is a patient that she has significant pain and wanted to work with her to provide accommodations, however regarding opioid prescriptions she will need to find future prescriptions from pain management. We will fill tramadol for 1 more month as patient tries to establish with pain management, referral placed  Patient with financial difficulties due to ongoing chronic health issues, referral to social work placed  P painful chronic dental caries treated with clindamycin    Subjective:  HPI:  Megan [name] is a 36 y.o. female who has Menorrhagia with irregular cycle; Fibroid; Fibroid tumor; BMI 40.0-44.9, adult ([place]); Other physical and mental strain related to work; Eczema; Adjustment disorder with mixed anxiety and depressed mood; and Vaginal discharge on their problem list..   She  has a past medical history of Arthritis, Drug dependence ([place]), and Tooth infection (01/28/2022)..   She presents with chief complaint of Follow-up (Unable to see [facility] gynecology/oncology due to insurance. Bottom  right and left tooth pain. Rx refill tramadol and triamcinolone cream ) .   Abdominal pain secondary to uterine fibroids.  Patient has ongoing bleeding and pain from fibroids.  She was previously evaluated by [facility] gynecologic oncology, but given the complexity of her fibroids and desire to keep your risk, she was referred to [facility].  However patient's insurance did not cover services from [facility].  She has recently been referred to [facility], however she has not yet heard back from them.  Patient pain was previously controlled on oxycodone, however patient was recently titrated to tramadol from GYN/ONC.  Patient states that she uses tramadol 50 mg up to 3 times a day as needed.  She did state that the oxycodone did control pain better and is requesting this prescription.  Patient reports that she has had a leave of absence from work but has been requested to return to work on November 3.  She reports that they have started to apply some of the combination requested a few months ago.  Patient does not work at her current job long enough to have [employer].  She also endorses financial difficulty including losing heating and concern of vehicle reposition due to financial difficulties.  She states that she will follow-up with her HR to see if there is any other accommodations that they can provide.  Dental caries.  Patient reports that she has recurrent bottom left and right molar tooth pain.  Most recent episode started 1 week  ago.  Pain is increasing.  Denies having any fevers or chills.  She states that she cannot afford to go to the dentist.    Patient has history of eczema.  Symptoms are well controlled with triamcinolone. Past Surgical History:  Procedure Laterality Date   ENDOMETRIAL BIOPSY  03/09/2022   LIPOMA EXCISION  2009   on forehead    Outpatient Medications Prior to Visit  Medication Sig Dispense Refill   ibuprofen (ADVIL) 600 MG tablet Take 1 tablet (600 mg total) by mouth every 8 (eight) hours as needed  for moderate pain. Take with food 30 tablet 1   medroxyPROGESTERone (PROVERA) 10 MG tablet Take 1 tablet (10 mg total) by mouth daily. 30 tablet 6   ondansetron (ZOFRAN-ODT) 4 MG disintegrating tablet Take 1 tablet (4 mg total) by mouth every 8 (eight) hours as needed for nausea or vomiting. 20 tablet 0   sertraline (ZOLOFT) 25 MG tablet Take 0.5 tablets (12.5 mg total) by mouth daily. 15 tablet 2   traMADol (ULTRAM) 50 MG tablet Take 1 tablet (50 mg total) by mouth every 6 (six) hours as needed for severe pain. For abdominal pain, do not take and drive 15 tablet 0   triamcinolone cream (KENALOG) 0.1 % Apply 1 Application topically 2 (two) times daily. 30 g 0   No facility-administered medications prior to visit.    Family History  Problem Relation Age of Onset   Bone cancer Maternal Aunt    Colon cancer Neg Hx    Breast cancer Neg Hx    Ovarian cancer Neg Hx    Endometrial cancer Neg Hx    Pancreatic cancer Neg Hx    Prostate cancer Neg Hx     Social History   Socioeconomic History   Marital status: Single    Spouse name: Not on file   Number of children: Not on file   Years of education: Not on file   Highest education level: Not on file  Occupational History   Occupation: on leave from work  Tobacco Use   Smoking status: Every Day    Packs/day: 0.50    Years: 18.00    Total pack years: 9.00    Types: Cigarettes   Smokeless tobacco: Never  Vaping Use   Vaping Use: Never used  Substance and Sexual Activity   Alcohol use: Yes    Alcohol/week: 4.0 standard drinks of alcohol    Types: 4 Cans of beer per week    Comment: weekly   Drug use: Yes   Sexual activity: Not Currently  Other Topics Concern   Not on file  Social History Narrative   Not on file   Social Determinants of Health   Financial Resource Strain: Not on file  Food Insecurity: Not on file  Transportation Needs: Not on file  Physical Activity: Not on file  Stress: Not on file  Social Connections:  Not on file  Intimate Partner Violence: Not on file  Objective:  Physical Exam: BP 128/84 (BP Location: Left Arm, Patient Position: Sitting, Cuff Size: Large)   Pulse 77   Temp (!) 97.5 F (36.4 C) (Temporal)   Wt 271 lb 9.6 oz (123.2 kg)   LMP  (LMP Unknown)   SpO2 97%   BMI 40.69 kg/m    General: No acute distress. Awake and conversant.  Eyes: Normal conjunctiva, anicteric. Round symmetric pupils.  ENT: Inflammation and caries throughout lower jaw no sign of purulent drainage Neck: Neck is supple. No masses or thyromegaly.  Respiratory: Respirations are non-labored. No auditory wheezing.  Skin: Warm. No rashes or ulcers.  Psych: Alert and oriented. Cooperative, Appropriate mood and affect, Normal judgment.  CV: No cyanosis or JVD ABD: Mild lower abdominal tenderness MSK: Normal ambulation. No clubbing  Neuro: Sensation and CN II-XII grossly normal.        Alesia Banda, MD, MS

## 2022-07-03 ENCOUNTER — Telehealth: Payer: Self-pay | Admitting: Family Medicine

## 2022-07-03 NOTE — Telephone Encounter (Signed)
This should be sent to our referral coordinator.

## 2022-07-03 NOTE — Telephone Encounter (Signed)
Pt was referred to [facility] on 07/02/22 by Dr. Grandville Silos. Her insurance called her and let her know she will not be covered there. She got the name of a physician she would like to be referred to [place] at [phone] at [address], [place]. She would like her referral changed to this facility.

## 2022-07-06 NOTE — Telephone Encounter (Signed)
Unable to find Megan [name]. Called PT to obtain info. No answer

## 2022-07-06 NOTE — Telephone Encounter (Signed)
S/w patient she sent me a list of providers and we agreed to send her referral to [facility].

## 2022-07-07 ENCOUNTER — Encounter: Payer: Self-pay | Admitting: Family Medicine

## 2022-07-08 ENCOUNTER — Telehealth: Payer: Self-pay

## 2022-07-08 NOTE — Telephone Encounter (Signed)
I connected with [facility] to follow up on referral.  Per Griselda Miner pt is scheduled on 07/27/22 @ 9:30 Dr. Berline Lopes notified

## 2022-07-13 ENCOUNTER — Other Ambulatory Visit: Payer: Self-pay | Admitting: Family Medicine

## 2022-07-13 DIAGNOSIS — R1030 Lower abdominal pain, unspecified: Secondary | ICD-10-CM

## 2022-07-14 ENCOUNTER — Ambulatory Visit (HOSPITAL_COMMUNITY)
Admission: EM | Admit: 2022-07-14 | Discharge: 2022-07-14 | Disposition: A | Discharge status: Home / Self Care | Payer: Commercial Managed Care - HMO | Attending: Family Medicine | Admitting: Family Medicine

## 2022-07-14 ENCOUNTER — Encounter (HOSPITAL_COMMUNITY): Payer: Self-pay

## 2022-07-14 DIAGNOSIS — N939 Abnormal uterine and vaginal bleeding, unspecified: Secondary | ICD-10-CM

## 2022-07-14 LAB — POCT URINALYSIS DIPSTICK, ED / UC
Bilirubin Urine: NEGATIVE
Glucose, UA: NEGATIVE mg/dL
Ketones, ur: NEGATIVE mg/dL
Leukocytes,Ua: NEGATIVE
Nitrite: NEGATIVE
Protein, ur: NEGATIVE mg/dL
Specific Gravity, Urine: 1.015 (ref 1.005–1.030)
Urobilinogen, UA: 0.2 mg/dL (ref 0.0–1.0)
pH: 7 (ref 5.0–8.0)

## 2022-07-14 LAB — HEMOGLOBIN AND HEMATOCRIT, BLOOD
HCT: 43.4 % (ref 36.0–46.0)
Hemoglobin: 14.1 g/dL (ref 12.0–15.0)

## 2022-07-14 LAB — POC URINE PREG, ED: Preg Test, Ur: NEGATIVE

## 2022-07-14 MED ORDER — MEGESTROL ACETATE 40 MG PO TABS: 40.0000 mg | ORAL_TABLET | Freq: Two times a day (BID) | ORAL | 0 refills | Status: DC

## 2022-07-14 MED ORDER — HYDROCODONE-ACETAMINOPHEN 5-325 MG PO TABS: 1.0000 | ORAL_TABLET | Freq: Four times a day (QID) | ORAL | 0 refills | Status: DC | PRN

## 2022-07-14 NOTE — Telephone Encounter (Signed)
Caller Name: Megan [name] Call back phone #: [phone]  Reason for Call: Pt called [facility], they have to review her case before accepting her. She was told this could take some time, can her referral be switched to [facility]?

## 2022-07-14 NOTE — Discharge Instructions (Addendum)
Be aware, you have been prescribed pain medications that may cause drowsiness. While taking this medication, do not take any other medications containing acetaminophen (Tylenol). Do not combine with alcohol or recreational drugs. Please do not drive, operate heavy machinery, or take part in activities that require making important decisions while on this medication as your judgement may be clouded.  You have had labs (blood test) sent today. We will call you with any significant abnormalities or if there is need to begin or change treatment or pursue further follow up.  You may also review your test results online through [place]. If you do not have a MyChart account, instructions to sign up should be on your discharge paperwork.

## 2022-07-14 NOTE — Telephone Encounter (Signed)
Referral moved to [facility] per request

## 2022-07-14 NOTE — Telephone Encounter (Signed)
Patient is aware 

## 2022-07-14 NOTE — ED Triage Notes (Signed)
Pt is here for abdominal pain and vaginal bleeding x 5days

## 2022-07-15 NOTE — ED Provider Notes (Addendum)
[facility]   [id number] 07/14/22 Arrival Time: 1625  ASSESSMENT & PLAN:  1. Abnormal vaginal bleeding    Known fibroids; long-standing with acute exacerbation. Is followed by OB/GYN. Has scheduled appt in a couple of weeks.  UPT negative. Hemoglobin and hematocrit, blood  Result Value Ref Range   Hemoglobin 14.1 12.0 - 15.0 g/dL   HCT 43.4 36.0 - 46.0 %    Begin: Meds ordered this encounter  Medications   HYDROcodone-acetaminophen (NORCO/VICODIN) 5-325 MG tablet    Sig: Take 1 tablet by mouth every 6 (six) hours as needed for moderate pain or severe pain.    Dispense:  12 tablet    Refill:  0   megestrol (MEGACE) 40 MG tablet    Sig: Take 1 tablet (40 mg total) by mouth 2 (two) times daily.    Dispense:  60 tablet    Refill:  0      Discharge Instructions      Be aware, you have been prescribed pain medications that may cause drowsiness. While taking this medication, do not take any other medications containing acetaminophen (Tylenol). Do not combine with alcohol or recreational drugs. Please do not drive, operate heavy machinery, or take part in activities that require making important decisions while on this medication as your judgement may be clouded.  You have had labs (blood test) sent today. We will call you with any significant abnormalities or if there is need to begin or change treatment or pursue further follow up.  You may also review your test results online through [place]. If you do not have a MyChart account, instructions to sign up should be on your discharge paperwork.     Without s/s of PID.  Labs Reviewed  POCT URINALYSIS DIPSTICK, ED / UC - Abnormal; Notable for the following components:      Result Value   Hgb urine dipstick LARGE (*)    All other components within normal limits  HEMOGLOBIN AND HEMATOCRIT, BLOOD  POC URINE PREG, ED   [place] Controlled Substances Registry consulted for this patient. I feel the risk/benefit ratio  today is favorable for proceeding with this prescription for a controlled substance. Medication sedation precautions given.  Reviewed expectations re: course of current medical issues. Questions answered. Outlined signs and symptoms indicating need for more acute intervention. Patient verbalized understanding. After Visit Summary given.   SUBJECTIVE:  Megan [name] is a 36 y.o. female who is here for abdominal pain and vaginal bleeding x 5 days. Known fibroids causing lower abd discomfort; fairly long-standing.  No specific aggravating or alleviating factors reported. Denies: urinary frequency and dysuria. Afebrile. No abdominal or pelvic pain. Normal PO intake wihout n/v. No genital rashes or lesions. OTC analgesics without relief.  No LMP recorded (lmp unknown).   OBJECTIVE:  Vitals:   07/14/22 1758  BP: (!) 144/86  Pulse: 95  Resp: 12  Temp: 98.7 F (37.1 C)  TempSrc: Oral  SpO2: 100%     General appearance: alert, cooperative, appears stated age and no distress Lungs: unlabored respirations; speaks full sentences without difficulty Back: no CVA tenderness; FROM at waist Abdomen: soft, with lower abd TTP; can feel firm uterus; no guarding or rebound GU: deferred Skin: warm and dry Psychological: alert and cooperative; normal mood and affect.  Results for orders placed or performed during the hospital encounter of 07/14/22  Hemoglobin and hematocrit, blood  Result Value Ref Range   Hemoglobin 14.1 12.0 - 15.0 g/dL   HCT 43.4 36.0 -  46.0 %  POC Urinalysis dipstick  Result Value Ref Range   Glucose, UA NEGATIVE NEGATIVE mg/dL   Bilirubin Urine NEGATIVE NEGATIVE   Ketones, ur NEGATIVE NEGATIVE mg/dL   Specific Gravity, Urine 1.015 1.005 - 1.030   Hgb urine dipstick LARGE (A) NEGATIVE   pH 7.0 5.0 - 8.0   Protein, ur NEGATIVE NEGATIVE mg/dL   Urobilinogen, UA 0.2 0.0 - 1.0 mg/dL   Nitrite NEGATIVE NEGATIVE   Leukocytes,Ua NEGATIVE NEGATIVE  POC urine pregnancy   Result Value Ref Range   Preg Test, Ur NEGATIVE NEGATIVE    Labs Reviewed  POCT URINALYSIS DIPSTICK, ED / UC - Abnormal; Notable for the following components:      Result Value   Hgb urine dipstick LARGE (*)    All other components within normal limits  HEMOGLOBIN AND HEMATOCRIT, BLOOD  POC URINE PREG, ED    Allergies  Allergen Reactions   Penicillins Other (See Comments)    Unknown since childhood  Pt states she has had penicillin with no complications    Past Medical History:  Diagnosis Date   Arthritis    Drug dependence ([place])    Tooth infection 01/28/2022   Family History  Problem Relation Age of Onset   Bone cancer Maternal Aunt    Colon cancer Neg Hx    Breast cancer Neg Hx    Ovarian cancer Neg Hx    Endometrial cancer Neg Hx    Pancreatic cancer Neg Hx    Prostate cancer Neg Hx    Social History   Socioeconomic History   Marital status: Single    Spouse name: Not on file   Number of children: Not on file   Years of education: Not on file   Highest education level: Not on file  Occupational History   Occupation: on leave from work  Tobacco Use   Smoking status: Every Day    Packs/day: 0.50    Years: 18.00    Total pack years: 9.00    Types: Cigarettes   Smokeless tobacco: Never  Vaping Use   Vaping Use: Never used  Substance and Sexual Activity   Alcohol use: Yes    Alcohol/week: 4.0 standard drinks of alcohol    Types: 4 Cans of beer per week    Comment: weekly   Drug use: Yes   Sexual activity: Not Currently  Other Topics Concern   Not on file  Social History Narrative   Not on file   Social Determinants of Health   Financial Resource Strain: Not on file  Food Insecurity: Not on file  Transportation Needs: Not on file  Physical Activity: Not on file  Stress: Not on file  Social Connections: Not on file  Intimate Partner Violence: Not on file           Vanessa Kick, MD 07/15/22 [place], [place], MD 07/15/22  [phone]

## 2022-07-17 ENCOUNTER — Telehealth: Payer: Self-pay | Admitting: Gynecologic Oncology

## 2022-07-17 ENCOUNTER — Other Ambulatory Visit: Payer: Self-pay | Admitting: Gynecologic Oncology

## 2022-07-17 DIAGNOSIS — D219 Benign neoplasm of connective and other soft tissue, unspecified: Secondary | ICD-10-CM

## 2022-07-17 DIAGNOSIS — R1030 Lower abdominal pain, unspecified: Secondary | ICD-10-CM

## 2022-07-17 MED ORDER — OXYCODONE HCL 5 MG PO TABS: 5.0000 mg | ORAL_TABLET | ORAL | 0 refills | Status: DC | PRN

## 2022-07-17 NOTE — Telephone Encounter (Signed)
Called the patient after her multiple messages requesting pain medication through MyChart.  She is continuing to struggle with abdominal pain that is not relieved with tramadol or over-the-counter medications.  Had reached out to her primary care provider who had her sign a pain contract but did not feel comfortable prescribing something other than tramadol and wanted her to see a pain specialist.  She has been to the emergency department once this month, first time since earlier this year.  She has an appointment with [facility] in approximately 11 days.  Unfortunately, she was scheduled to see [facility] last month but it was discovered on the day of her appointment that the type of insurance she had was not taken by [facility].  The patient is still very much interested in preserving her uterus if possible.  She sounds more open to the idea of hysterectomy if this is recommended at her upcoming appointment.  I have asked her to keep me updated after she meets with the subspecialist at [facility].  I voiced my concern about patients who are getting narcotic prescriptions from multiple providers and the risk of mixing different narcotic medications.  The patient verbalizes understanding of this.  Plan to send in oxycodone supply for 5 days now, will send second prescription next week.  Jeral Pinch MD Gynecologic Oncology

## 2022-07-17 NOTE — Progress Notes (Signed)
See telephone note from Dr. Berline Lopes. Per Dr. Berline Lopes, plan to sent in oxycodone prescription to get patient to [facility] appt on Nov 27.

## 2022-07-22 ENCOUNTER — Encounter: Payer: Self-pay | Admitting: Gynecologic Oncology

## 2022-07-22 ENCOUNTER — Other Ambulatory Visit: Payer: Self-pay | Admitting: Gynecologic Oncology

## 2022-07-22 DIAGNOSIS — D219 Benign neoplasm of connective and other soft tissue, unspecified: Secondary | ICD-10-CM

## 2022-07-22 DIAGNOSIS — R1030 Lower abdominal pain, unspecified: Secondary | ICD-10-CM

## 2022-07-22 MED ORDER — OXYCODONE HCL 5 MG PO TABS: 5.0000 mg | ORAL_TABLET | ORAL | 0 refills | Status: DC | PRN

## 2022-07-22 NOTE — Progress Notes (Signed)
Per Dr. Berline Lopes, a refill for oxycodone #30 sent in for the patient to use for severe abdominal pain to get her to her upcoming appt with [facility]. Called [facility] to confirm she picked up her prescription on 11/17 since not showing up in [place] controlled substance website. The patient has been experiencing severe abdominal pain due to a large abdominal mass.

## 2022-07-29 ENCOUNTER — Ambulatory Visit (INDEPENDENT_AMBULATORY_CARE_PROVIDER_SITE_OTHER): Payer: BLUE CROSS/BLUE SHIELD | Admitting: Family Medicine

## 2022-07-29 ENCOUNTER — Encounter: Payer: Self-pay | Admitting: Family Medicine

## 2022-07-29 VITALS — BP 132/86 | HR 79 | Temp 97.5°F | Wt 267.4 lb

## 2022-07-29 DIAGNOSIS — R110 Nausea: Secondary | ICD-10-CM | POA: Diagnosis not present

## 2022-07-29 DIAGNOSIS — G8929 Other chronic pain: Secondary | ICD-10-CM

## 2022-07-29 DIAGNOSIS — D219 Benign neoplasm of connective and other soft tissue, unspecified: Secondary | ICD-10-CM | POA: Diagnosis not present

## 2022-07-29 DIAGNOSIS — R102 Pelvic and perineal pain: Secondary | ICD-10-CM

## 2022-07-29 DIAGNOSIS — L309 Dermatitis, unspecified: Secondary | ICD-10-CM

## 2022-07-29 MED ORDER — CYCLOBENZAPRINE HCL 5 MG PO TABS: 5.0000 mg | ORAL_TABLET | Freq: Three times a day (TID) | ORAL | 1 refills | Status: DC | PRN

## 2022-07-29 MED ORDER — CELECOXIB 200 MG PO CAPS: 200.0000 mg | ORAL_CAPSULE | Freq: Two times a day (BID) | ORAL | 2 refills | Status: DC

## 2022-07-29 MED ORDER — TRIAMCINOLONE ACETONIDE 0.1 % EX CREA: 1.0000 | TOPICAL_CREAM | Freq: Two times a day (BID) | CUTANEOUS | 0 refills | Status: DC

## 2022-07-29 MED ORDER — ONDANSETRON HCL 4 MG PO TABS: 4.0000 mg | ORAL_TABLET | Freq: Three times a day (TID) | ORAL | 1 refills | Status: AC | PRN

## 2022-07-29 NOTE — Patient Instructions (Signed)
For abdominal pain, try celbrex, and flexeril.

## 2022-07-30 ENCOUNTER — Telehealth: Payer: Self-pay | Admitting: Family Medicine

## 2022-07-30 NOTE — Telephone Encounter (Signed)
Caller Name: Elfa Call back phone #: [phone]  REFERRAL REQUEST Has patient seen PCP for this complaint? Yes.   Referral for which specialty: Pain Mgmt Preferred provider/office: [facility] [place] - do not refer to [facility]. She has not been able to get someone to call her back. She has [employer] as her primary and [employer] secondary. She would like a different office

## 2022-08-02 NOTE — Progress Notes (Signed)
Assessment/Plan:   Problem List Items Addressed This Visit       Musculoskeletal and Integument   Eczema   Relevant Medications   triamcinolone cream (KENALOG) 0.1 %     Other   Fibroid tumor - Primary   Relevant Medications   ondansetron (ZOFRAN) 4 MG tablet   celecoxib (CELEBREX) 200 MG capsule   cyclobenzaprine (FLEXERIL) 5 MG tablet   Other Visit Diagnoses     Chronic pelvic pain syndrome in female       Relevant Medications   ondansetron (ZOFRAN) 4 MG tablet   celecoxib (CELEBREX) 200 MG capsule   cyclobenzaprine (FLEXERIL) 5 MG tablet   Chronic nausea       Relevant Medications   ondansetron (ZOFRAN) 4 MG tablet   celecoxib (CELEBREX) 200 MG capsule   cyclobenzaprine (FLEXERIL) 5 MG tablet      For abdominal pain secondary to fibroids, trial of celecoxib, DC Robaxin and trial Flexeril, Zofran for nausea, encourage patient follow-up pain management for additional management  Refilled eczema cream, triamcinolone   Subjective:  HPI:  Megan [name] is a 36 y.o. female who has Menorrhagia with irregular cycle; Fibroid; Fibroid tumor; BMI 40.0-44.9, adult ([place]); Other physical and mental strain related to work; Eczema; Adjustment disorder with mixed anxiety and depressed mood; and Vaginal discharge on their problem list..   She  has a past medical history of Arthritis, Drug dependence ([place]), and Tooth infection (01/28/2022)..   She presents with chief complaint of Follow-up (Fibroid surgery. Patient would like to discuss muscle relaxer. Would like to discuss HPV vaccine? Weaning off of pain medication. Rx refill triamcinolone and zofran.) .   Abdominal pain secondary to uterine fibroids.  Patient reports that she has had cessation of vaginal bleeding while on Megace.  She does have ongoing abdominal cramping pain.  Her nausea is well-controlled on Zofran.  Patient is currently here awaiting evaluation by [facility] surgical gynecologic oncology for management of  fibroids.  For pain, patient was previously getting opioids in addition to NSAIDs and muscle relaxants.she has been referred to pain management, but is in the process of getting appointment due to insurance coverage issues.  Patient reports that Robaxin did not help as much as Flexeril did for pain.  She is interested in trialing NSAID for symptoms.  Eczema.  Patient has history of eczema.  Symptoms are well controlled with triamcinolone. Past Surgical History:  Procedure Laterality Date   ENDOMETRIAL BIOPSY  03/09/2022   LIPOMA EXCISION  2009   on forehead    Outpatient Medications Prior to Visit  Medication Sig Dispense Refill   megestrol (MEGACE) 40 MG tablet Take 1 tablet (40 mg total) by mouth 2 (two) times daily. 60 tablet 0   oxyCODONE (OXY IR/ROXICODONE) 5 MG immediate release tablet Take 1 tablet (5 mg total) by mouth every 4 (four) hours as needed for severe pain. Only take for severe pain, Do not take and drive, do not take with other pain medications 30 tablet 0   sertraline (ZOLOFT) 25 MG tablet Take 0.5 tablets (12.5 mg total) by mouth daily. 15 tablet 2   ondansetron (ZOFRAN-ODT) 4 MG disintegrating tablet Take 1 tablet (4 mg total) by mouth every 8 (eight) hours as needed for nausea or vomiting. 20 tablet 0   triamcinolone cream (KENALOG) 0.1 % Apply 1 Application topically 2 (two) times daily. 30 g 0   ibuprofen (ADVIL) 600 MG tablet Take 1 tablet (600 mg total) by mouth every 8 (eight)  hours as needed for moderate pain. Take with food (Patient not taking: Reported on 07/29/2022) 30 tablet 1   methocarbamol (ROBAXIN-750) 750 MG tablet Take 1 tablet (750 mg total) by mouth every 8 (eight) hours as needed for muscle spasms. (Patient not taking: Reported on 07/29/2022) 60 tablet 0   No facility-administered medications prior to visit.    Family History  Problem Relation Age of Onset   Bone cancer Maternal Aunt    Colon cancer Neg Hx    Breast cancer Neg Hx    Ovarian cancer  Neg Hx    Endometrial cancer Neg Hx    Pancreatic cancer Neg Hx    Prostate cancer Neg Hx     Social History   Socioeconomic History   Marital status: Single    Spouse name: Not on file   Number of children: Not on file   Years of education: Not on file   Highest education level: Not on file  Occupational History   Occupation: on leave from work  Tobacco Use   Smoking status: Every Day    Packs/day: 0.50    Years: 18.00    Total pack years: 9.00    Types: Cigarettes   Smokeless tobacco: Never  Vaping Use   Vaping Use: Never used  Substance and Sexual Activity   Alcohol use: Yes    Alcohol/week: 4.0 standard drinks of alcohol    Types: 4 Cans of beer per week    Comment: weekly   Drug use: Yes   Sexual activity: Not Currently  Other Topics Concern   Not on file  Social History Narrative   Not on file   Social Determinants of Health   Financial Resource Strain: Not on file  Food Insecurity: Not on file  Transportation Needs: Not on file  Physical Activity: Not on file  Stress: Not on file  Social Connections: Not on file  Intimate Partner Violence: Not on file                                                                                                 Objective:  Physical Exam: BP 132/86 (BP Location: Left Arm, Patient Position: Sitting, Cuff Size: Large)   Pulse 79   Temp (!) 97.5 F (36.4 C) (Temporal)   Wt 267 lb 6.4 oz (121.3 kg)   LMP  (LMP Unknown)   SpO2 98%   BMI 40.06 kg/m    General: No acute distress. Awake and conversant.  Eyes: Normal conjunctiva, anicteric. Round symmetric pupils.  ENT: Inflammation and caries throughout lower jaw no sign of purulent drainage Neck: Neck is supple. No masses or thyromegaly.  Respiratory: Respirations are non-labored. No auditory wheezing.  Skin: Warm. No rashes or ulcers.  Psych: Alert and oriented. Cooperative, Appropriate mood and affect, Normal judgment.  CV: No cyanosis or JVD ABD: Mild lower  abdominal tenderness, no rebound or guarding MSK: Normal ambulation. No clubbing  Neuro: Sensation and CN II-XII grossly normal.        Alesia Banda, MD, MS

## 2022-08-05 NOTE — Telephone Encounter (Signed)
Type of forms received:leave as an accommodation  Routed DX:AJOI [employer] received by :  terrill  Individual made aware of 3-5 business day turn around (Y/N): yes Form completed and patient made aware of charges(Y/N): yes  Faxed to :   Form location:   dr Grandville Silos box

## 2022-08-06 NOTE — Telephone Encounter (Signed)
Forms placed in provider's office for review

## 2022-08-10 ENCOUNTER — Telehealth: Payer: Self-pay | Admitting: Family Medicine

## 2022-08-10 NOTE — Telephone Encounter (Signed)
Caller Name: PT Call back phone #: [phone]   MEDICATION(S):  cyclobenzaprine (FLEXERIL) 5 MG tablet [[id number]]   Days of Med Remaining:   Has the patient contacted their pharmacy (YES/NO)? NO What did pharmacy advise?   Preferred Pharmacy:  [facility] [place], [address] Phone: [phone]  Fax: [phone]      ~~~Please advise patient/caregiver to allow 2-3 business days to process RX refills.

## 2022-08-11 ENCOUNTER — Telehealth: Payer: Self-pay | Admitting: Family Medicine

## 2022-08-11 NOTE — Telephone Encounter (Signed)
I advised patient to check with previous office Joylene John, NP) who prescribed the medication to see if they can refill oxycodone until she can get into a new office. Patient states that she doesn't want to be seen by a [facility] office. Please advise. Are we able to send patient to new office or would she reach out to Gynecology?

## 2022-08-11 NOTE — Telephone Encounter (Signed)
Pt is wanting a cb concerning her not being able to get her oxyCODONE (OXY IR/ROXICODONE) 5 MG immediate release tablet [[id number]] medication. I reminded her that we have not prescribed this for her. She is just wanting a cb concerning this issue. Pt @ [phone] (Mobile)

## 2022-08-11 NOTE — Telephone Encounter (Signed)
Patient is aware of annotation below and verbalized understanding.  

## 2022-08-11 NOTE — Telephone Encounter (Signed)
Pt called back, this has been resolved.

## 2022-08-17 ENCOUNTER — Other Ambulatory Visit: Payer: Self-pay | Admitting: Family Medicine

## 2022-08-17 ENCOUNTER — Telehealth: Payer: Self-pay | Admitting: Family Medicine

## 2022-08-17 DIAGNOSIS — M5136 Other intervertebral disc degeneration, lumbar region: Secondary | ICD-10-CM

## 2022-08-17 NOTE — Telephone Encounter (Signed)
Pt called stating she had orders sent from [facility] for an xray of her lumbar spine but we told her we cannot do it because our xray tech is out. Pt states [employer] ([place]) called and told her they received a referral from [facility] but need an order from Dr. Grandville Silos for xray lumbar spine 4 view.

## 2022-08-17 NOTE — Progress Notes (Signed)
Left patient a detailed voice message regarding annotation and to return call to office for any further concerns.

## 2022-08-20 ENCOUNTER — Other Ambulatory Visit: Payer: Self-pay

## 2022-08-20 DIAGNOSIS — D219 Benign neoplasm of connective and other soft tissue, unspecified: Secondary | ICD-10-CM

## 2022-08-20 DIAGNOSIS — R110 Nausea: Secondary | ICD-10-CM

## 2022-08-20 DIAGNOSIS — G8929 Other chronic pain: Secondary | ICD-10-CM

## 2022-08-20 MED ORDER — CYCLOBENZAPRINE HCL 5 MG PO TABS: 5.0000 mg | ORAL_TABLET | Freq: Three times a day (TID) | ORAL | 1 refills | Status: DC | PRN

## 2022-08-20 NOTE — Telephone Encounter (Signed)
Chart supports rx. Last OV: [id number]

## 2022-08-31 DIAGNOSIS — Z419 Encounter for procedure for purposes other than remedying health state, unspecified: Secondary | ICD-10-CM | POA: Diagnosis not present

## 2022-09-14 ENCOUNTER — Telehealth: Payer: Self-pay

## 2022-09-14 NOTE — Telephone Encounter (Signed)
Patient called about Dr. Berline Lopes proceeding with surgery.  Patient was seen at [facility] and stated that she does not qualify for surgery because the fibroid is too big and wants to proceed with Dr. Berline Lopes doing surgery.  Informed patient that the information will be given to Dr. Berline Lopes and someone from our office will call her back.

## 2022-09-15 NOTE — Telephone Encounter (Signed)
Patient called back today and stated "I was following up on the call from yesterday. Dr Berline Lopes referred me to [facility] for my surgery. I didn't qualify for the for MIS myomectomy. I would like to come back to [facility] and have Dr Berline Lopes have my surgery. I want to have the partial hysterectomy. I am out of hormone pill and pain pills. SoI going to try and go to the urgent care. I have lost my car and I'm not working. I am jsut ready for this to be done. [facility] can't even do my surgery until the end of March." Explained that the message would be give to [facility] APP/Dr Berline Lopes and the office would call her back.

## 2022-09-15 NOTE — Telephone Encounter (Signed)
I'm happy to see her back, but could we get clarification from [facility]? The last note I see in December is that they offered attempt at open myomectomy. Thank you

## 2022-09-17 NOTE — Telephone Encounter (Signed)
Yes, agreed. I'd like to plan to get her set up for embolization day before the surgery

## 2022-09-29 ENCOUNTER — Telehealth: Payer: Self-pay | Admitting: *Deleted

## 2022-09-29 NOTE — Telephone Encounter (Signed)
Patient to see Dr Berline Lopes on 2/8 t 8:30 am, patient aware

## 2022-10-01 ENCOUNTER — Other Ambulatory Visit: Payer: Self-pay | Admitting: Gynecologic Oncology

## 2022-10-01 ENCOUNTER — Telehealth: Payer: Self-pay | Admitting: Oncology

## 2022-10-01 ENCOUNTER — Other Ambulatory Visit: Payer: Self-pay | Admitting: Oncology

## 2022-10-01 DIAGNOSIS — Z419 Encounter for procedure for purposes other than remedying health state, unspecified: Secondary | ICD-10-CM | POA: Diagnosis not present

## 2022-10-01 DIAGNOSIS — D219 Benign neoplasm of connective and other soft tissue, unspecified: Secondary | ICD-10-CM

## 2022-10-01 IMAGING — CT CT ABD-PELV W/ CM
2 of 4 series · 16 of 46 positions shown, 18 images · IV contrast (APPLIED)
Comparison: None Available.

CLINICAL DATA: Abdominal pain.

EXAM:
CT ABDOMEN AND PELVIS WITH CONTRAST
TECHNIQUE: Multidetector CT imaging of the abdomen and pelvis was performed
using the standard protocol following bolus administration of
intravenous contrast.

[Series 3: abdomen 5.0 · axial · 0.91mm/px · z∈[-862,-378]mm · 13 of 111 slices shown, 15 images]
[im 7/111  soft-tissue]
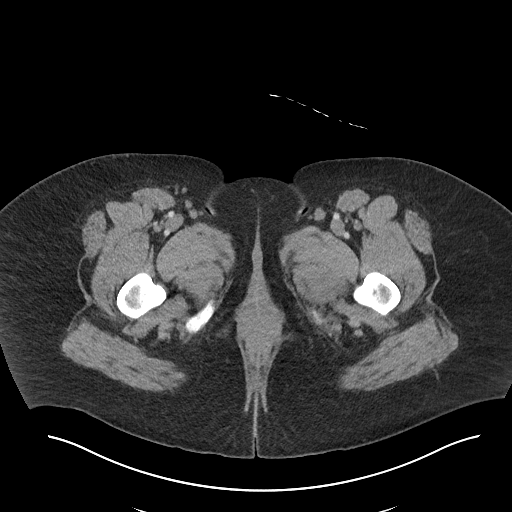
[im 7/111  bone]
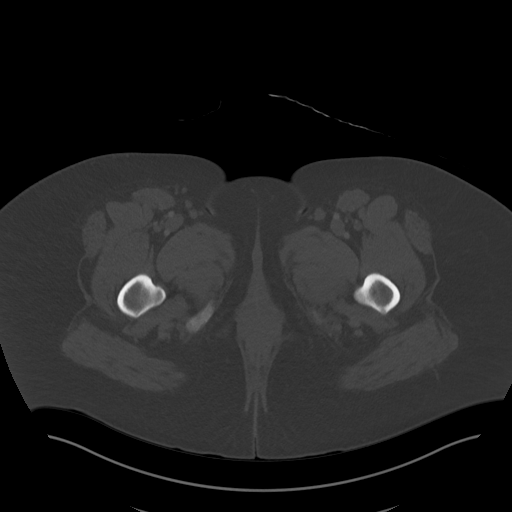
[im 13/111  soft-tissue]
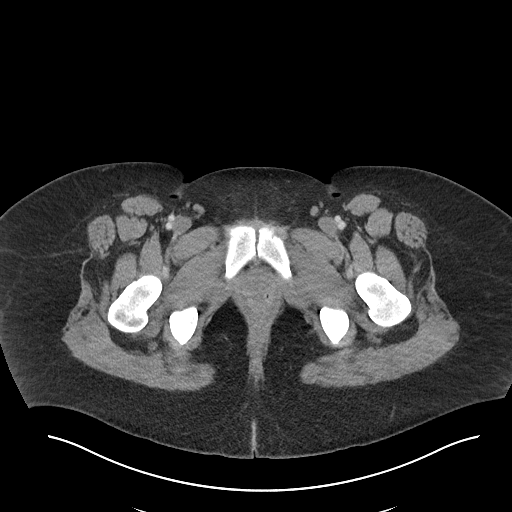
[im 26/111  soft-tissue]
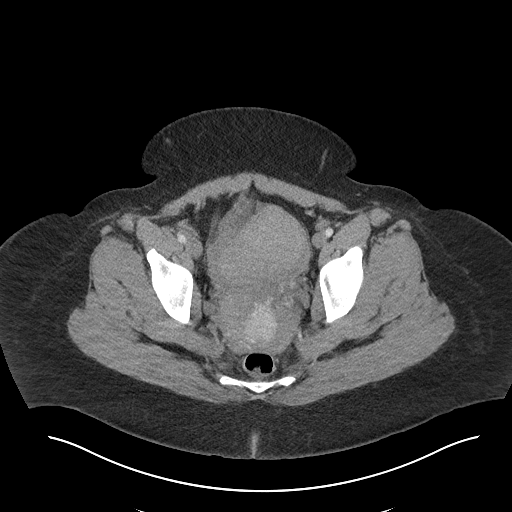
[im 33/111  soft-tissue]
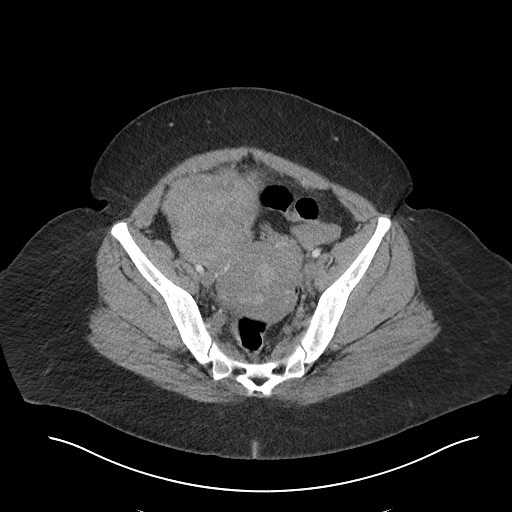
[im 39/111  soft-tissue]
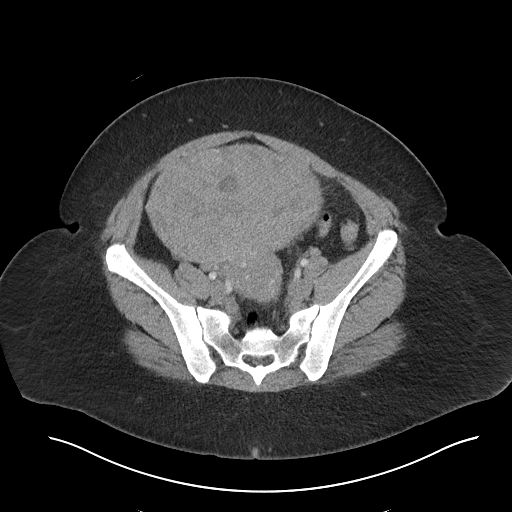
[im 46/111  soft-tissue]
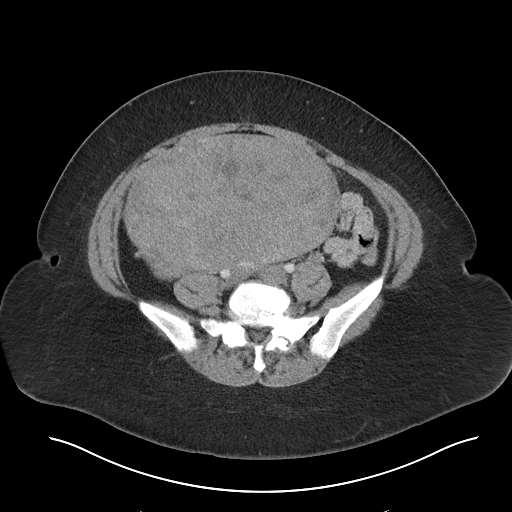
[im 59/111  soft-tissue]
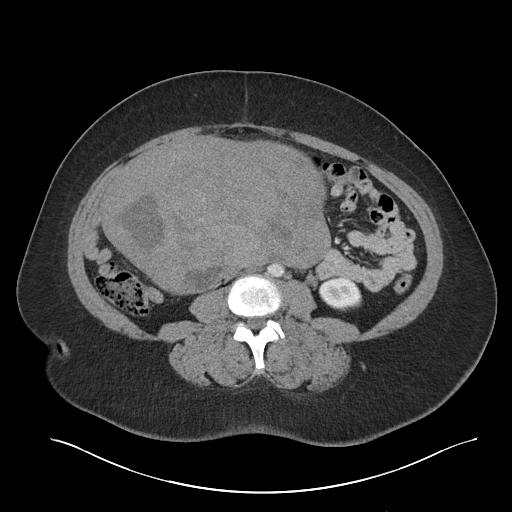
[im 65/111  soft-tissue]
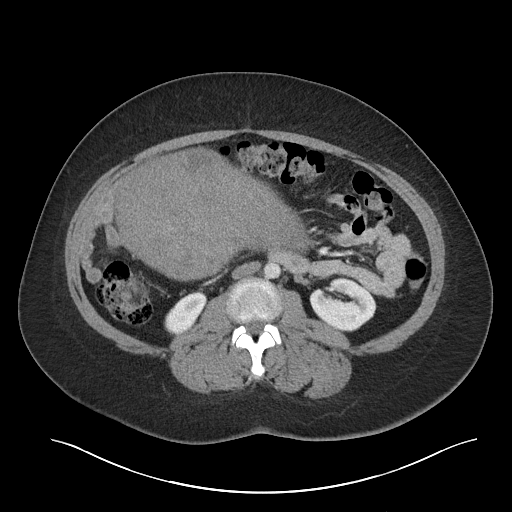
[im 72/111  soft-tissue]
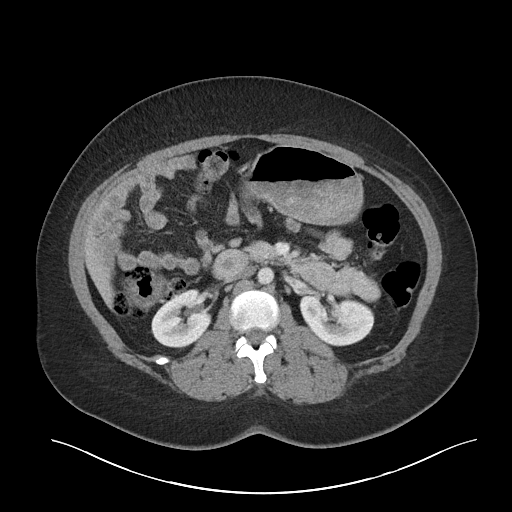
[im 72/111  bone]
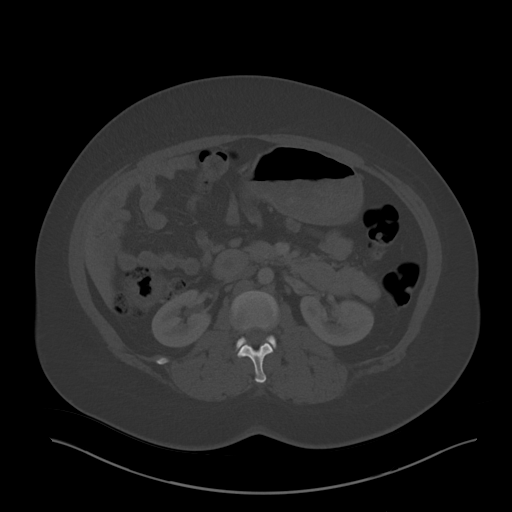
[im 78/111  soft-tissue]
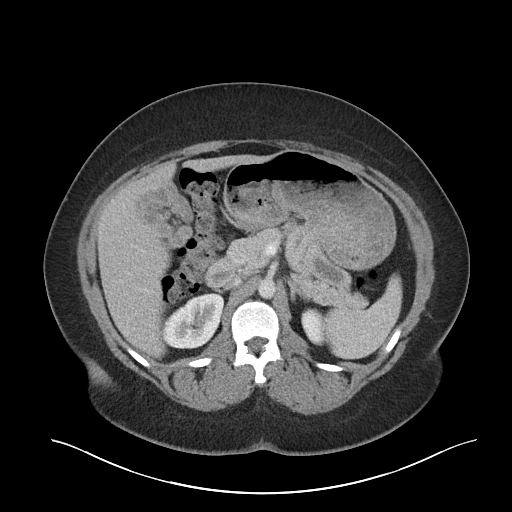
[im 85/111  soft-tissue]
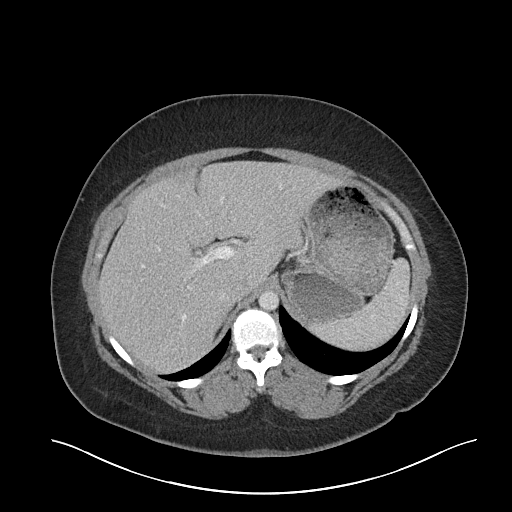
[im 98/111  soft-tissue]
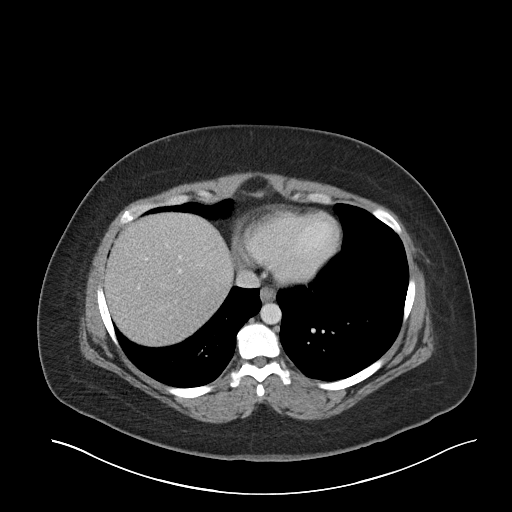
[im 104/111  soft-tissue]
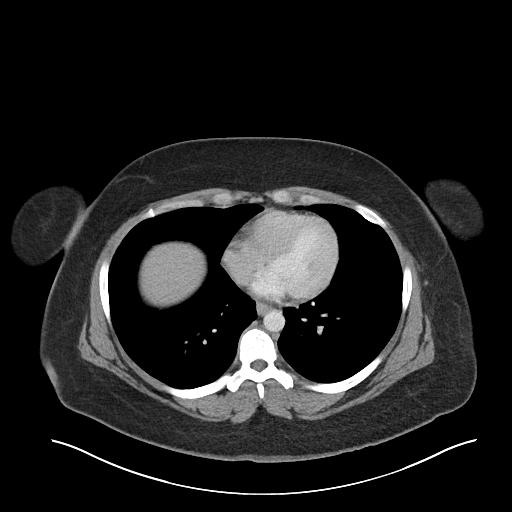

[Series 6: abdomen 3.0 mpr cor · coronal · 1.03mm/px · 3 of 108 slices shown]
[im 36/108  soft-tissue]
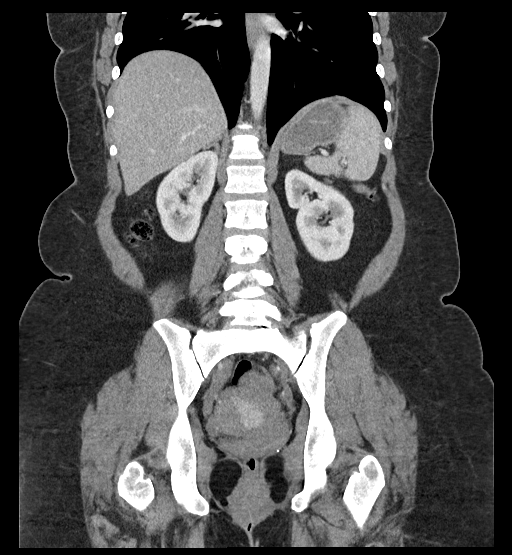
[im 48/108  soft-tissue]
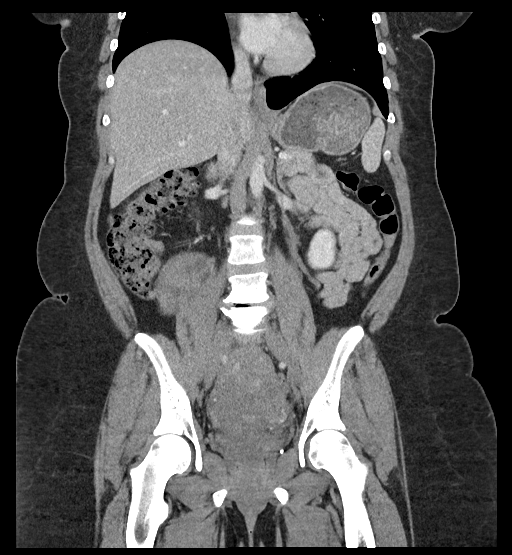
[im 60/108  soft-tissue]
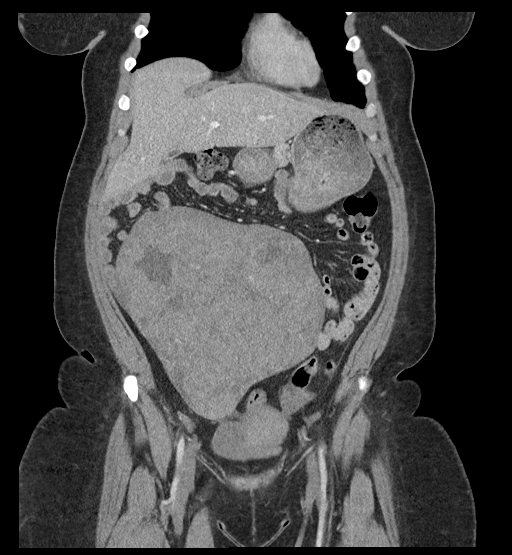

[16 of 46 positions shown; findings below may reference images not displayed]

RADIATION DOSE REDUCTION: This exam was performed according to the
departmental dose-optimization program which includes automated
exposure control, adjustment of the mA and/or kV according to
patient size and/or use of iterative reconstruction technique.

CONTRAST:  100mL OMNIPAQUE IOHEXOL 300 MG/ML  SOLN
FINDINGS: Lower chest: The visualized lung bases are clear.

No intra-abdominal free air or free fluid.

Hepatobiliary: The liver is unremarkable. No intrahepatic biliary
dilatation. The gallbladder is contracted. No calcified gallstone.

Pancreas: Unremarkable. No pancreatic ductal dilatation or
surrounding inflammatory changes.

Spleen: Normal in size without focal abnormality.

Adrenals/Urinary Tract: The adrenal glands are unremarkable. The
kidneys, and the visualized ureters appear unremarkable. The urinary
bladder is collapsed.

Stomach/Bowel: There is no bowel obstruction or active inflammation.
The appendix is normal.

Vascular/Lymphatic: The abdominal aorta and IVC are unremarkable. No
portal venous gas. There is no adenopathy.

Reproductive: Enlarged myomatous uterus with multiple fibroids. The
uterus measures approximately 23 cm in craniocaudal length with a
large fundal fibroid extending into the abdomen and displacing bowel
loops and causing some compression on the IVC. The largest exophytic
fundal fibroid measures approximately 21 x 15 x 20 cm. Gynecology
referral is advised.

Other: None

Musculoskeletal: Degenerative changes of the lower lumbar spine. No
acute osseous pathology.
IMPRESSION: 1. Enlarged myomatous uterus with a large exophytic fundal fibroid
extending into the abdomen. Gynecology referral is advised.
2. No bowel obstruction. Normal appendix.

## 2022-10-01 NOTE — Progress Notes (Unsigned)
Order placed for IR evaluation for uterine artery embolization.

## 2022-10-01 NOTE — Telephone Encounter (Signed)
Called Megan [name] and discussed the appointment for IR evaluation for a uterine artery embolization.  She verbalized agreement and said it is scheduled for 10/06/22.

## 2022-10-06 ENCOUNTER — Ambulatory Visit
Admission: RE | Admit: 2022-10-06 | Discharge: 2022-10-06 | Disposition: A | Discharge status: Home / Self Care | Payer: Medicaid Other | Source: Ambulatory Visit | Attending: Gynecologic Oncology | Admitting: Gynecologic Oncology

## 2022-10-06 ENCOUNTER — Other Ambulatory Visit: Payer: Self-pay | Admitting: Gynecologic Oncology

## 2022-10-06 ENCOUNTER — Telehealth: Payer: Self-pay

## 2022-10-06 DIAGNOSIS — D219 Benign neoplasm of connective and other soft tissue, unspecified: Secondary | ICD-10-CM

## 2022-10-06 DIAGNOSIS — N3289 Other specified disorders of bladder: Secondary | ICD-10-CM | POA: Diagnosis not present

## 2022-10-06 DIAGNOSIS — R1030 Lower abdominal pain, unspecified: Secondary | ICD-10-CM

## 2022-10-06 DIAGNOSIS — D259 Leiomyoma of uterus, unspecified: Secondary | ICD-10-CM | POA: Diagnosis not present

## 2022-10-06 HISTORY — PX: IR RADIOLOGIST EVAL & MGMT: IMG5224

## 2022-10-06 NOTE — Consult Note (Addendum)
Chief Complaint: Symptomatic uterine fibroids  Referring Physician(s): Cross,Melissa D Dr. Jeral Pinch  History of Present Illness: Megan [name] is a 37 y.o. female presenting today as a scheduled consultation to [facility], kindly referred by Joylene John of Dr. Charisse March office, for evaluation of symptomatic uterine fibroids and candidacy for embolization.   Ms Megan [name] is here by herself in the office for the interview.   She tells me the diagnosis of fibroids was made in May of 2023, when she was referred to the ED by her primary for ongoing abdominal pain.  A CT was performed 01/05/22, which shows significantly enlarged, fibroid uterus.  MRI followed after that 04/24/22.    She complains of all 3 of 3 categories of symptoms of fibroids, bleeding, bulk and pain.   She tells me that regarding her bleeding symptoms, she has is experiencing basically ongoing bleeding, since May of last year.  She has been wearing pads everyday.  She has given up on wearing any tampons, as she say this has become painful.  She also describes some clotting, with pad changes frequently during the day.   Despite this, she has not required a blood transfusion.  Regarding pain, she describes 8/10 pain.  She has been prescribed oxycodone, which she says only helps somewhat, and does not do away with all the pain.   Regarding bulk symptoms she complains of constipation, with sometimes painful urination and moving her bowels.  .   Calculated UFS-QoL symptom severity transformed score = 68.75  She is not married, and does have a current boyfriend.  She does not have any children of her own.  She has a sister that lives here in [place] with her, and she has additional brother/sisters in [facility], where she was born.  She says "they all have children" so she has nieces/nephews.  She is a bit tearful as we discuss the fact that she is now committed to a hysterectomy, as she tells me that she  previously wanted children of her own.   MRI 04/24/22:     Past Medical History:  Diagnosis Date   Arthritis    Drug dependence ([place])    Tooth infection 01/28/2022    Past Surgical History:  Procedure Laterality Date   ENDOMETRIAL BIOPSY  03/09/2022   LIPOMA EXCISION  2009   on forehead    Allergies: Penicillins and Tramadol  Medications: Prior to Admission medications   Medication Sig Start Date End Date Taking? Authorizing Provider  celecoxib (CELEBREX) 200 MG capsule Take 1 capsule (200 mg total) by mouth 2 (two) times daily. 07/29/22 10/27/22  Bonnita Hollow, MD  ondansetron (ZOFRAN) 4 MG tablet Take 1 tablet (4 mg total) by mouth every 8 (eight) hours as needed for nausea or vomiting. 07/29/22   Bonnita Hollow, MD  oxyCODONE (OXY IR/ROXICODONE) 5 MG immediate release tablet Take 1 tablet (5 mg total) by mouth every 4 (four) hours as needed for severe pain. Only take for severe pain, Do not take and drive, do not take with other pain medications 07/22/22   Joylene John D, NP  triamcinolone cream (KENALOG) 0.1 % Apply 1 Application topically 2 (two) times daily. 07/29/22   Bonnita Hollow, MD     Family History  Problem Relation Age of Onset   Bone cancer Maternal Aunt    Colon cancer Neg Hx    Breast cancer Neg Hx    Ovarian cancer Neg Hx    Endometrial cancer Neg  Hx    Pancreatic cancer Neg Hx    Prostate cancer Neg Hx     Social History   Socioeconomic History   Marital status: Single    Spouse name: Not on file   Number of children: Not on file   Years of education: Not on file   Highest education level: Not on file  Occupational History   Occupation: on leave from work  Tobacco Use   Smoking status: Every Day    Packs/day: 0.50    Years: 18.00    Total pack years: 9.00    Types: Cigarettes   Smokeless tobacco: Never  Vaping Use   Vaping Use: Never used  Substance and Sexual Activity   Alcohol use: Yes    Alcohol/week: 4.0 standard  drinks of alcohol    Types: 4 Cans of beer per week    Comment: weekly   Drug use: Yes   Sexual activity: Not Currently  Other Topics Concern   Not on file  Social History Narrative   Not on file   Social Determinants of Health   Financial Resource Strain: Not on file  Food Insecurity: Not on file  Transportation Needs: Not on file  Physical Activity: Not on file  Stress: Not on file  Social Connections: Not on file       Review of Systems: A 12 point ROS discussed and pertinent positives are indicated in the HPI above.  All other systems are negative.  Review of Systems  Vital Signs: BP (!) 149/86   Pulse (!) 108   Temp 98.6 F (37 C)   SpO2 100% Comment: room air    Physical Exam General: 37 yo female appearing stated age.  Well-developed, well-nourished.  No distress. HEENT: Atraumatic, normocephalic.  Conjugate gaze, extra-ocular motor intact. No scleral icterus or scleral injection. No lesions on external ears, nose, lips, or gums.  Oral mucosa moist, pink.  Neck: Symmetric with no goiter enlargement.  Chest/Lungs:  Symmetric chest with inspiration/expiration.  No labored breathing.  Clear to auscultation with no wheezes, rhonchi, or rales.  Heart:  RRR, with no third heart sounds appreciated. No JVD appreciated.  Abdomen:   + bowel sounds.   Genito-urinary: Deferred Neurologic: Alert & Oriented to person, place, and time.   Normal affect and insight.  Appropriate questions.  Moving all 4 extremities with gross sensory intact.  Pulse Exam:  No bruit appreciated.    Palpable right and left radial pulse.  Extremities: No swelling lower extremities.     Mallampati Score:     Imaging: No results found.  Labs:  CBC: Recent Labs    01/04/22 2235 02/11/22 0930 03/09/22 1040 04/09/22 1125 07/14/22 1847  WBC 9.5 9.8 6.9 9.7  --   HGB 15.4* 13.9 14.1 13.8 14.1  HCT 42.8 40.9 40.4 38.9 43.4  PLT 251 184.0 227 272  --     COAGS: Recent Labs     01/01/22 1056  INR 0.9  APTT 25.4    BMP: Recent Labs    01/01/22 1056 01/04/22 2235  NA 139 140  K 4.0 3.9  CL 104 107  CO2 27 24  GLUCOSE 103* 84  BUN 16 9  CALCIUM 8.9 9.4  CREATININE 1.02 0.92  GFRNONAA  --  >60    LIVER FUNCTION TESTS: Recent Labs    01/01/22 1056 01/04/22 2235  BILITOT 0.5 0.7  AST 23 26  ALT 19 20  ALKPHOS 54 61  PROT 7.2 6.0*  ALBUMIN 4.0 2.6*    TUMOR MARKERS: No results for input(s): "AFPTM", "CEA", "CA199", "CHROMGRNA" in the last 8760 hours.  Assessment and Plan:   Ms Deguire is a 37 yo female with symptomatic uterine fibroids, and would be a candidate for uterine artery embolization.   I had a lengthy discussion with her regarding the anatomy, pathology, and treatment options for fibroids.  Specifically, I mentioned hormonal therapy, surgical hysterectomy/myomectomy, uterine fibroid embolization, and HIFU.    Regarding UFE, we had a discussion regarding the efficacy, expectations regarding outcomes/long term efficacy, and risk/benefit.   I shared with her a summary of the literature for UFE efficacy, which generally is accepted to be adequate symptom relief with good restoration of quality of life at 1 year in ~90% or greater of patients for symptom control.    In her situation, in which her OB/GYN team is requesting a pre-operative embolization, we also discussed this specific indication, which is for decreasing anticipated blood loss in the setting of an anticipated complicated surgery.   We discussed the timing of such a pre-op treatment, which could be anywhere from days ahead of time to the day prior, to the day of, the proposed surgery.   My impression is that the embolization is best performed on the day of the surgery, as this would be the most comfortable for her, as the first 24 hours after UFE is quite uncomfortable, and post-embolization syndrome in the first day or so can sometimes lead to low grade fever and other  symptoms that can complicate GETA.    After discussing, she would like to proceed with pre-operative embolization, anticipating a surgery with Dr. Berline Lopes.    Plan: - plan for pre-operative uterine fibroid embolization, with indication of mitigating operative blood loss for Dr. Charisse March planned surgery.  I feel that this would be best on the day-of/morning-of the proposed surgery, but certain can be performed the immediate day before, if needs be.  - we will have to coordinate with Dr. Charisse March office.      Electronically Signed: Corrie Mckusick 10/06/2022, 1:46 PM   I spent a total of 65 Miinutes    in face to face in clinical consultation, greater than 50% of which was counseling/coordinating care for symptomatic uterine fibroids, pre-operative uterine fibroid embolization.

## 2022-10-06 NOTE — Telephone Encounter (Signed)
Per Joylene John NP, Megan [name] is aware she has been scheduled for surgery on 2/21. This may have to be change based on the IR availability. Pt voiced an understanding and will discuss further at appointment on Thursday 2/8 with [facility].

## 2022-10-07 ENCOUNTER — Other Ambulatory Visit: Payer: Self-pay | Admitting: Gynecologic Oncology

## 2022-10-07 DIAGNOSIS — D219 Benign neoplasm of connective and other soft tissue, unspecified: Secondary | ICD-10-CM

## 2022-10-07 NOTE — Patient Instructions (Signed)
DUE TO COVID-19 ONLY TWO VISITORS  (aged 39 and older)  ARE ALLOWED TO COME WITH YOU AND STAY IN THE WAITING ROOM ONLY DURING PRE OP AND PROCEDURE.   **NO VISITORS ARE ALLOWED IN THE SHORT STAY AREA OR RECOVERY ROOM!!**  IF YOU WILL BE ADMITTED INTO THE HOSPITAL YOU ARE ALLOWED ONLY FOUR SUPPORT PEOPLE DURING VISITATION HOURS ONLY (7 AM -8PM)   The support person(s) must pass our screening, gel in and out, and wear a mask at all times, including in the patient's room. Patients must also wear a mask when staff or their support person are in the room. Visitors GUEST BADGE MUST BE WORN VISIBLY  One adult visitor may remain with you overnight and MUST be in the room by 8 P.M.     Your procedure is scheduled on: 10/21/22   Report to [facility] Main Entrance    Report to admitting at : 6:15 AM   Call this number if you have problems the morning of surgery [phone]   Eat a light diet the day before surgery.  Examples including soups, broths, toast, yogurt, mashed potatoes.  Things to avoid include carbonated beverages (fizzy beverages), raw fruits and raw vegetables, or beans.   If your bowels are filled with gas, your surgeon will have difficulty visualizing your pelvic organs which increases your surgical risks.  Do not eat food :After Midnight.   After Midnight you may have the following liquids until : 5:30 AM DAY OF SURGERY  Water Black Coffee (sugar ok, NO MILK/CREAM OR CREAMERS)  Tea (sugar ok, NO MILK/CREAM OR CREAMERS) regular and decaf                             Plain Jell-O (NO RED)                                           Fruit ices (not with fruit pulp, NO RED)                                     Popsicles (NO RED)                                                                  Juice: apple, WHITE grape, WHITE cranberry Sports drinks like Gatorade (NO RED)    Oral Hygiene is also important to reduce your risk of infection.                                     Remember - BRUSH YOUR TEETH THE MORNING OF SURGERY WITH YOUR REGULAR TOOTHPASTE  DENTURES WILL BE REMOVED PRIOR TO SURGERY PLEASE DO NOT APPLY "Poly grip" OR ADHESIVES!!!   Do NOT smoke after Midnight   Take these medicines the morning of surgery with A SIP OF WATER: N/A  You may not have any metal on your body including hair pins, jewelry, and body piercing             Do not wear make-up, lotions, powders, perfumes/cologne, or deodorant  Do not wear nail polish including gel and S&S, artificial/acrylic nails, or any other type of covering on natural nails including finger and toenails. If you have artificial nails, gel coating, etc. that needs to be removed by a nail salon please have this removed prior to surgery or surgery may need to be canceled/ delayed if the surgeon/ anesthesia feels like they are unable to be safely monitored.   Do not shave  48 hours prior to surgery.    Do not bring valuables to the hospital. [place].   Contacts, glasses, or bridgework may not be worn into surgery.   Bring small overnight bag day of surgery.   DO NOT [place]. PHARMACY WILL DISPENSE MEDICATIONS LISTED ON YOUR MEDICATION LIST TO YOU DURING YOUR ADMISSION [place]!    Patients discharged on the day of surgery will not be allowed to drive home.  Someone NEEDS to stay with you for the first 24 hours after anesthesia.   Special Instructions: Bring a copy of your healthcare power of attorney and living will documents         the day of surgery if you haven't scanned them before.              Please read over the following fact sheets you were given: IF YOU HAVE QUESTIONS ABOUT YOUR PRE-OP INSTRUCTIONS PLEASE CALL [phone]    [facility] - Preparing for Surgery Before surgery, you can play an important role.  Because skin is not sterile, your skin needs to be as free of  germs as possible.  You can reduce the number of germs on your skin by washing with CHG (chlorahexidine gluconate) soap before surgery.  CHG is an antiseptic cleaner which kills germs and bonds with the skin to continue killing germs even after washing. Please DO NOT use if you have an allergy to CHG or antibacterial soaps.  If your skin becomes reddened/irritated stop using the CHG and inform your nurse when you arrive at Short Stay. Do not shave (including legs and underarms) for at least 48 hours prior to the first CHG shower.  You may shave your face/neck. Please follow these instructions carefully:  1.  Shower with CHG Soap the night before surgery and the  morning of Surgery.  2.  If you choose to wash your hair, wash your hair first as usual with your  normal  shampoo.  3.  After you shampoo, rinse your hair and body thoroughly to remove the  shampoo.                           4.  Use CHG as you would any other liquid soap.  You can apply chg directly  to the skin and wash                       Gently with a scrungie or clean washcloth.  5.  Apply the CHG Soap to your body ONLY FROM THE NECK DOWN.   Do not use on face/ open  Wound or open sores. Avoid contact with eyes, ears mouth and genitals (private parts).                       Wash face,  Genitals (private parts) with your normal soap.             6.  Wash thoroughly, paying special attention to the area where your surgery  will be performed.  7.  Thoroughly rinse your body with warm water from the neck down.  8.  DO NOT shower/wash with your normal soap after using and rinsing off  the CHG Soap.                9.  Pat yourself dry with a clean towel.            10.  Wear clean pajamas.            11.  Place clean sheets on your bed the night of your first shower and do not  sleep with pets. Day of Surgery : Do not apply any lotions/deodorants the morning of surgery.  Please wear clean clothes to the  hospital/surgery center.  FAILURE TO FOLLOW THESE INSTRUCTIONS MAY RESULT IN THE CANCELLATION OF YOUR SURGERY PATIENT SIGNATURE_________________________________  NURSE SIGNATURE__________________________________  ________________________________________________________________________ WHAT IS A BLOOD TRANSFUSION? Blood Transfusion Information  A transfusion is the replacement of blood or some of its parts. Blood is made up of multiple cells which provide different functions. Red blood cells carry oxygen and are used for blood loss replacement. White blood cells fight against infection. Platelets control bleeding. Plasma helps clot blood. Other blood products are available for specialized needs, such as hemophilia or other clotting disorders. BEFORE THE TRANSFUSION  Who gives blood for transfusions?  Healthy volunteers who are fully evaluated to make sure their blood is safe. This is blood bank blood. Transfusion therapy is the safest it has ever been in the practice of medicine. Before blood is taken from a donor, a complete history is taken to make sure that person has no history of diseases nor engages in risky social behavior (examples are intravenous drug use or sexual activity with multiple partners). The donor's travel history is screened to minimize risk of transmitting infections, such as malaria. The donated blood is tested for signs of infectious diseases, such as HIV and hepatitis. The blood is then tested to be sure it is compatible with you in order to minimize the chance of a transfusion reaction. If you or a relative donates blood, this is often done in anticipation of surgery and is not appropriate for emergency situations. It takes many days to process the donated blood. RISKS AND COMPLICATIONS Although transfusion therapy is very safe and saves many lives, the main dangers of transfusion include:  Getting an infectious disease. Developing a transfusion reaction. This is an  allergic reaction to something in the blood you were given. Every precaution is taken to prevent this. The decision to have a blood transfusion has been considered carefully by your caregiver before blood is given. Blood is not given unless the benefits outweigh the risks. AFTER THE TRANSFUSION Right after receiving a blood transfusion, you will usually feel much better and more energetic. This is especially true if your red blood cells have gotten low (anemic). The transfusion raises the level of the red blood cells which carry oxygen, and this usually causes an energy increase. The nurse administering the transfusion will monitor you carefully for complications.  HOME CARE INSTRUCTIONS  No special instructions are needed after a transfusion. You may find your energy is better. Speak with your caregiver about any limitations on activity for underlying diseases you may have. SEEK MEDICAL CARE IF:  Your condition is not improving after your transfusion. You develop redness or irritation at the intravenous (IV) site. SEEK IMMEDIATE MEDICAL CARE IF:  Any of the following symptoms occur over the next 12 hours: Shaking chills. You have a temperature by mouth above 102 F (38.9 C), not controlled by medicine. Chest, back, or muscle pain. People around you feel you are not acting correctly or are confused. Shortness of breath or difficulty breathing. Dizziness and fainting. You get a rash or develop hives. You have a decrease in urine output. Your urine turns a dark color or changes to pink, red, or brown. Any of the following symptoms occur over the next 10 days: You have a temperature by mouth above 102 F (38.9 C), not controlled by medicine. Shortness of breath. Weakness after normal activity. The white part of the eye turns yellow (jaundice). You have a decrease in the amount of urine or are urinating less often. Your urine turns a dark color or changes to pink, red, or brown. Document  Released: 08/14/2000 Document Revised: 11/09/2011 Document Reviewed: 04/02/2008 [facility] Patient Information 2014 [place], [place].  _______________________________________________________________________

## 2022-10-07 NOTE — Progress Notes (Signed)
Pt. Needs orders for surgery.

## 2022-10-08 ENCOUNTER — Encounter (HOSPITAL_COMMUNITY): Payer: Self-pay

## 2022-10-08 ENCOUNTER — Inpatient Hospital Stay: Payer: Medicaid Other | Admitting: Gynecologic Oncology

## 2022-10-08 ENCOUNTER — Other Ambulatory Visit: Payer: Self-pay

## 2022-10-08 ENCOUNTER — Encounter (HOSPITAL_COMMUNITY)
Admission: RE | Admit: 2022-10-08 | Discharge: 2022-10-08 | Disposition: A | Discharge status: Home / Self Care | Payer: Medicaid Other | Source: Ambulatory Visit | Attending: Gynecologic Oncology | Admitting: Gynecologic Oncology

## 2022-10-08 DIAGNOSIS — N921 Excessive and frequent menstruation with irregular cycle: Secondary | ICD-10-CM

## 2022-10-08 DIAGNOSIS — Z01812 Encounter for preprocedural laboratory examination: Secondary | ICD-10-CM | POA: Diagnosis not present

## 2022-10-08 DIAGNOSIS — D219 Benign neoplasm of connective and other soft tissue, unspecified: Secondary | ICD-10-CM | POA: Diagnosis not present

## 2022-10-08 DIAGNOSIS — Z124 Encounter for screening for malignant neoplasm of cervix: Secondary | ICD-10-CM

## 2022-10-08 HISTORY — DX: Depression, unspecified: F32.A

## 2022-10-08 HISTORY — DX: Anxiety disorder, unspecified: F41.9

## 2022-10-08 LAB — CBC
HCT: 33.1 % — ABNORMAL LOW (ref 36.0–46.0)
Hemoglobin: 10.4 g/dL — ABNORMAL LOW (ref 12.0–15.0)
MCH: 26.4 pg (ref 26.0–34.0)
MCHC: 31.4 g/dL (ref 30.0–36.0)
MCV: 84 fL (ref 80.0–100.0)
Platelets: 496 10*3/uL — ABNORMAL HIGH (ref 150–400)
RBC: 3.94 MIL/uL (ref 3.87–5.11)
RDW: 14 % (ref 11.5–15.5)
WBC: 10.4 10*3/uL (ref 4.0–10.5)
nRBC: 0 % (ref 0.0–0.2)

## 2022-10-08 LAB — TYPE AND SCREEN
ABO/RH(D): O POS
Antibody Screen: NEGATIVE

## 2022-10-08 LAB — COMPREHENSIVE METABOLIC PANEL
ALT: 20 U/L (ref 0–44)
AST: 22 U/L (ref 15–41)
Albumin: 3.2 g/dL — ABNORMAL LOW (ref 3.5–5.0)
Alkaline Phosphatase: 85 U/L (ref 38–126)
Anion gap: 13 (ref 5–15)
BUN: 10 mg/dL (ref 6–20)
CO2: 25 mmol/L (ref 22–32)
Calcium: 9 mg/dL (ref 8.9–10.3)
Chloride: 97 mmol/L — ABNORMAL LOW (ref 98–111)
Creatinine, Ser: 0.98 mg/dL (ref 0.44–1.00)
GFR, Estimated: >60 mL/min (ref >60)
Glucose, Bld: 76 mg/dL (ref 70–99)
Potassium: 3.7 mmol/L (ref 3.5–5.1)
Sodium: 135 mmol/L (ref 135–145)
Total Bilirubin: 0.7 mg/dL (ref 0.3–1.2)
Total Protein: 8.5 g/dL — ABNORMAL HIGH (ref 6.5–8.1)

## 2022-10-08 NOTE — Progress Notes (Signed)
For Short Stay: [place] appointment date:  Bowel Prep reminder:   For Anesthesia: PCP - Dr. Josephine Igo Cardiologist - N/A  Chest x-ray -  EKG -  Stress Test -  ECHO -  Cardiac Cath -  Pacemaker/ICD device last checked: Pacemaker orders received: Device Rep notified:  Spinal Cord Stimulator:  Sleep Study -  CPAP -   Fasting Blood Sugar -  Checks Blood Sugar _____ times a day Date and result of last Hgb A1c-  Last dose of GLP1 agonist-  GLP1 instructions:   Last dose of SGLT-2 inhibitors-  SGLT-2 instructions:   Blood Thinner Instructions: Aspirin Instructions: Last Dose:  Activity level: Can go up a flight of stairs and activities of daily living without stopping and without chest pain and/or shortness of breath   Able to exercise without chest pain and/or shortness of breath  Anesthesia review: : HX: Smoker.  Patient denies shortness of breath, fever, cough and chest pain at PAT appointment   Patient verbalized understanding of instructions that were given to them at the PAT appointment. Patient was also instructed that they will need to review over the PAT instructions again at home before surgery.

## 2022-10-08 NOTE — Progress Notes (Signed)
The patient present > 15 minutes late for her appointment. She was rescheduled for 2/9.

## 2022-10-09 ENCOUNTER — Encounter: Payer: Self-pay | Admitting: Gynecologic Oncology

## 2022-10-09 ENCOUNTER — Telehealth: Payer: Self-pay

## 2022-10-09 ENCOUNTER — Telehealth: Payer: Self-pay | Admitting: Oncology

## 2022-10-09 ENCOUNTER — Inpatient Hospital Stay: Payer: Medicaid Other | Attending: Gynecologic Oncology | Admitting: Gynecologic Oncology

## 2022-10-09 ENCOUNTER — Inpatient Hospital Stay (HOSPITAL_BASED_OUTPATIENT_CLINIC_OR_DEPARTMENT_OTHER): Payer: Medicaid Other | Admitting: Gynecologic Oncology

## 2022-10-09 VITALS — BP 132/75 | HR 104 | Temp 99.2°F | Resp 18 | Ht 68.0 in | Wt 257.8 lb

## 2022-10-09 DIAGNOSIS — B3731 Acute candidiasis of vulva and vagina: Secondary | ICD-10-CM | POA: Diagnosis not present

## 2022-10-09 DIAGNOSIS — R6881 Early satiety: Secondary | ICD-10-CM | POA: Diagnosis not present

## 2022-10-09 DIAGNOSIS — R1030 Lower abdominal pain, unspecified: Secondary | ICD-10-CM

## 2022-10-09 DIAGNOSIS — F1120 Opioid dependence, uncomplicated: Secondary | ICD-10-CM | POA: Insufficient documentation

## 2022-10-09 DIAGNOSIS — R102 Pelvic and perineal pain: Secondary | ICD-10-CM

## 2022-10-09 DIAGNOSIS — R109 Unspecified abdominal pain: Secondary | ICD-10-CM | POA: Diagnosis not present

## 2022-10-09 DIAGNOSIS — D500 Iron deficiency anemia secondary to blood loss (chronic): Secondary | ICD-10-CM

## 2022-10-09 DIAGNOSIS — K5900 Constipation, unspecified: Secondary | ICD-10-CM | POA: Insufficient documentation

## 2022-10-09 DIAGNOSIS — D259 Leiomyoma of uterus, unspecified: Secondary | ICD-10-CM | POA: Diagnosis not present

## 2022-10-09 DIAGNOSIS — R140 Abdominal distension (gaseous): Secondary | ICD-10-CM | POA: Insufficient documentation

## 2022-10-09 DIAGNOSIS — D219 Benign neoplasm of connective and other soft tissue, unspecified: Secondary | ICD-10-CM

## 2022-10-09 DIAGNOSIS — N939 Abnormal uterine and vaginal bleeding, unspecified: Secondary | ICD-10-CM | POA: Insufficient documentation

## 2022-10-09 DIAGNOSIS — F1190 Opioid use, unspecified, uncomplicated: Secondary | ICD-10-CM

## 2022-10-09 DIAGNOSIS — B379 Candidiasis, unspecified: Secondary | ICD-10-CM

## 2022-10-09 DIAGNOSIS — E669 Obesity, unspecified: Secondary | ICD-10-CM

## 2022-10-09 MED ORDER — OXYCODONE HCL 5 MG PO TABS: 5.0000 mg | ORAL_TABLET | ORAL | 0 refills | Status: DC | PRN

## 2022-10-09 MED ORDER — SENNOSIDES-DOCUSATE SODIUM 8.6-50 MG PO TABS: 2.0000 | ORAL_TABLET | Freq: Every day | ORAL | 1 refills | Status: AC

## 2022-10-09 MED ORDER — POLYETHYLENE GLYCOL 3350 17 G PO PACK: 17.0000 g | PACK | Freq: Every day | ORAL | 2 refills | Status: DC

## 2022-10-09 MED ORDER — FLUCONAZOLE 150 MG PO TABS: 150.0000 mg | ORAL_TABLET | Freq: Every day | ORAL | 1 refills | Status: DC

## 2022-10-09 NOTE — Progress Notes (Signed)
GYN Onc Note  Patient seen today in the office requesting a refill on oxycodone to be sent to [facility]. Upon review of chart, it is noted in a progress note from 2013, the patient had sought care for detoxing from opiates at that time. In this note, it states she attempted at that time to be seen in a methadone clinic but was told she needed to detox first.   Our office will plan to make a referral to a chronic pain clinic given upcoming surgery and opoid dependence being a chronic problem.

## 2022-10-09 NOTE — H&P (View-Only) (Signed)
Gynecologic Oncology Return Clinic Visit  10/09/22  Reason for Visit:  follow-up, treatment planning   Treatment History: Patient presented in July 2023 after noticing abnormal uterine bleeding and abdominal fullness in the past year that has worsened with time.  This was initially noticing that she felt something when she was laying on her belly.  With time, she noticed increasing abdominal girth and that her clothes were fitting differently.  She endorses a long history of heavy bleeding with frequent passage of clots during her menses; menses would typically be painful and last approximately 1 week.  Her menses were regular though, without any intermenstrual bleeding or spotting.  She received a Depo-Provera shot on 01/19/2022 to help with bleeding and was prescribed oral progesterone in May after she developed abnormal uterine bleeding.  She describes this is daily bleeding with intermittent pain that comes and goes.  If she is stressed or more active, she will have heavier bleeding.  She wears 2 thick pads at a time and change them about every hour and a half, noting that they are saturated.  When she urinates, she will pass up to plum sized clots.  Patient was seen recently in the setting of her enlarged fibroid uterus and abnormal bleeding, was given prescription for Lysteda.  This was too expensive and patient has not been able to have it filled.   01/04/2022: CT of the abdomen and pelvis shows enlarged myomatous uterus with multiple fibroids.  Uterus measures approximately 23 cm in craniocaudal length with a large fundal fibroid extending into the abdomen and displacing bowel loops and causing some compression of the IVC.  The largest exophytic fundal fibroid measures approximately 21 x 15 x 20 cm.  No adenopathy, no free fluid, no evidence of carcinomatosis.   On 03/09/2022, she underwent endometrial biopsy, which showed minute strips of inactive endometrium mixed with blood and normal endocervical  tissue.  Negative for hyperplasia or malignancy.   04/09/22: Pap NIML, HR HPV negative   04/24/2022: MRI abdomen/pelvis: Large mass extending from the pelvis into the mid abdomen measuring 24 x 24 x 15 cm, appears to extend from the uterine fundus and most likely reflects a uterine fibroid.  Given the size and complexity of this lesion, a uterine leiomyosarcoma is on the differential.  Enlarged uterus with multiple additional uterine masses, likely reflecting uterine fibroids.  Ovaries not confidently identified on this exam.   Patient seen by [facility] given her desire to preserve uterus if at all possible.  Plan had been for open myomectomy but after review of MRI, recommendation was made to proceed with definitive surgery.  Patient met with interventional radiology on 2/6 to discuss preoperative embolization.  Interval History: Patient reports that she is overall doing well although has continued to have more pain related to her large fibroid.  Bleeding has decreased and overall is darker in appearance and looks more like old blood rather than bright red blood.  She is continuing to use oxycodone regularly for pain.  Thinks that she has developed a yeast infection.  She continues to struggle with constipation.  Uses Ex-Lax and laxative intermittently.  Reports having 1 bowel movement a week.  Continues to struggle with appetite and bloating/early satiety.  Endorses urinary frequency.  Due to pain, had to stop working.  Has lost her car.  Past Medical/Surgical History: Past Medical History:  Diagnosis Date   Anxiety    Arthritis    Depression    Drug dependence ([place])  Tooth infection 01/28/2022    Past Surgical History:  Procedure Laterality Date   ENDOMETRIAL BIOPSY  03/09/2022   IR RADIOLOGIST EVAL & MGMT  10/06/2022   LIPOMA EXCISION  2009   on forehead    Family History  Problem Relation Age of Onset   Bone cancer Maternal Aunt    Colon cancer Neg Hx    Breast cancer Neg Hx     Ovarian cancer Neg Hx    Endometrial cancer Neg Hx    Pancreatic cancer Neg Hx    Prostate cancer Neg Hx     Social History   Socioeconomic History   Marital status: Single    Spouse name: Not on file   Number of children: Not on file   Years of education: Not on file   Highest education level: Not on file  Occupational History   Occupation: on leave from work  Tobacco Use   Smoking status: Every Day    Packs/day: 0.50    Years: 18.00    Total pack years: 9.00    Types: Cigarettes   Smokeless tobacco: Never  Vaping Use   Vaping Use: Never used  Substance and Sexual Activity   Alcohol use: Not Currently    Alcohol/week: 4.0 standard drinks of alcohol    Types: 4 Cans of beer per week    Comment: weekly   Drug use: Yes    Types: Other-see comments    Comment: CBD eatables   Sexual activity: Not Currently  Other Topics Concern   Not on file  Social History Narrative   Not on file   Social Determinants of Health   Financial Resource Strain: Not on file  Food Insecurity: Not on file  Transportation Needs: Not on file  Physical Activity: Not on file  Stress: Not on file  Social Connections: Not on file    Current Medications:  Current Outpatient Medications:    fluconazole (DIFLUCAN) 150 MG tablet, Take 1 tablet (150 mg total) by mouth daily., Disp: 1 tablet, Rfl: 1   polyethylene glycol (MIRALAX) 17 g packet, Take 17 g by mouth daily., Disp: 14 each, Rfl: 2   senna-docusate (SENOKOT-S) 8.6-50 MG tablet, Take 2 tablets by mouth at bedtime. For AFTER surgery, do not take if having diarrhea, Disp: 30 tablet, Rfl: 1   celecoxib (CELEBREX) 200 MG capsule, Take 1 capsule (200 mg total) by mouth 2 (two) times daily. (Patient not taking: Reported on 10/06/2022), Disp: 60 capsule, Rfl: 2   docusate sodium (COLACE) 100 MG capsule, Take 100 mg by mouth daily., Disp: , Rfl:    ondansetron (ZOFRAN) 4 MG tablet, Take 1 tablet (4 mg total) by mouth every 8 (eight) hours as needed  for nausea or vomiting., Disp: 30 tablet, Rfl: 1   oxyCODONE (OXY IR/ROXICODONE) 5 MG immediate release tablet, Take 1 tablet (5 mg total) by mouth every 4 (four) hours as needed for severe pain. Only take for severe pain, Do not take and drive, do not take with other pain medications, Disp: 30 tablet, Rfl: 0   sertraline (ZOLOFT) 25 MG tablet, Take 25 mg by mouth at bedtime., Disp: , Rfl:    triamcinolone cream (KENALOG) 0.1 %, Apply 1 Application topically 2 (two) times daily., Disp: 30 g, Rfl: 0  Review of Systems: + Decreased appetite, fatigue, bloating, abdominal pain, constipation, frequency, hot flashes, pelvic pain, back pain, muscle pain/cramp, itch, anxiety, depression. Denies fevers, chills, unexplained weight changes. Denies hearing loss, neck lumps or masses,  mouth sores, ringing in ears or voice changes. Denies cough or wheezing.  Denies shortness of breath. Denies chest pain or palpitations. Denies leg swelling. Denies blood in stools, diarrhea, nausea, vomiting, or early satiety. Denies pain with intercourse, dysuria, hematuria or incontinence. Denies joint pain. Denies rash, or wounds. Denies dizziness, headaches, numbness or seizures. Denies swollen lymph nodes or glands, denies easy bruising or bleeding. Denies confusion or decreased concentration.  Physical Exam: BP 132/75 (BP Location: Right Arm, Patient Position: Sitting)   Pulse (!) 104   Temp 99.2 F (37.3 C) (Oral)   Resp 18   Ht 5' 8"$  (1.727 m)   Wt 257 lb 12.8 oz (116.9 kg)   LMP  (LMP Unknown)   SpO2 100%   BMI 39.20 kg/m  General: Alert, oriented, no acute distress. HEENT: Normocephalic, atraumatic, sclera anicteric. Chest: Clear to auscultation bilaterally.  No wheezes or rhonchi. Cardiovascular: Regular rate and rhythm, no murmurs. Abdomen: Obese. Normoactive bowel sounds. Enlarged mass within the mid and upper abdomen, palpated approximately 8 cm above the umbilicus, firm, mass mostly in the midline  and to the right.  Minimal mobility due to its size. No palpable fluid wave.  Extremities: Grossly normal range of motion.  Warm, well perfused.  No edema bilaterally.  Laboratory & Radiologic Studies:    Latest Ref Rng & Units 10/08/2022   10:03 AM 07/14/2022    6:47 PM 04/09/2022   11:25 AM  CBC  WBC 4.0 - 10.5 K/uL 10.4   9.7   Hemoglobin 12.0 - 15.0 g/dL 10.4  14.1  13.8   Hematocrit 36.0 - 46.0 % 33.1  43.4  38.9   Platelets 150 - 400 K/uL 496   272       Latest Ref Rng & Units 10/08/2022   10:03 AM 01/04/2022   10:35 PM 01/01/2022   10:56 AM  BMP  Glucose 70 - 99 mg/dL 76  84  103   BUN 6 - 20 mg/dL 10  9  16   $ Creatinine 0.44 - 1.00 mg/dL 0.98  0.92  1.02   Sodium 135 - 145 mmol/L 135  140  139   Potassium 3.5 - 5.1 mmol/L 3.7  3.9  4.0   Chloride 98 - 111 mmol/L 97  107  104   CO2 22 - 32 mmol/L 25  24  27   $ Calcium 8.9 - 10.3 mg/dL 9.0  9.4  8.9    Assessment & Plan: Megan [name] is a 37 y.o. woman with enlarged fibroid uterus.   We reviewed discussed plan to move forward with definitive surgery.  Patient has spent much time considering her options and is understanding of the fact that she will likely lose her uterus.  She has excepted this and would like to move forward with surgery.  Plan will be for total hysterectomy with bilateral salpingectomy.  If the fibroid appears more pedunculated and has a stalk that I think can be safely transected, we discussed the possibility of myomectomy with preservation of the uterus.  Uterus versus fibroid will be sent for frozen section.  If benign, we will plan to take bilateral fallopian tubes and leave both ovaries in situ.  If malignant, depending on suspected histology, we will make a decision IntraOp whether safe to leave bilateral ovaries. We discussed the unlikely scenario that this mass represents a large solid ovarian mass.  If this were the case, I could proceed with unilateral oophorectomy and bilateral salpingectomy.  The mass  would  still be sent for frozen section and decisions about the uterus and contralateral ovary would be made based on frozen section.  We discussed that in the setting of a malignancy, whether uterine or ovarian, additional procedures may need to be performed including lymph node sampling, peritoneal biopsies, and omentectomy.  The patient was consented for a diagnostic laparoscopy, open versus robotic assisted hysterectomy, possible myomectomy, bilateral salpingectomy, possible unilateral versus bilateral oophorectomy, possible staging. The risks of surgery were discussed in detail and she understands these to include infection; wound separation; hernia; vaginal cuff separation, injury to adjacent organs such as bowel, bladder, blood vessels, ureters and nerves; bleeding which may require blood transfusion; anesthesia risk; thromboembolic events; possible death; unforeseen complications; possible need for re-exploration; medical complications such as heart attack, stroke, pleural effusion and pneumonia; and, if full lymphadenectomy is performed the risk of lymphedema and lymphocyst. The patient will receive DVT and antibiotic prophylaxis as indicated. She voiced a clear understanding. She had the opportunity to ask questions. Perioperative instructions were reviewed with her. Prescriptions for post-op medications were sent to her pharmacy of choice.  Despite heavy bleeding, the patient has not been anemic until recent blood work.  Hemoglobin is just over 10.  Plan for preoperative embolization to decrease blood loss associated with surgery.  Given anticipated need for larger incision, even if the surgery can be done in a minimally invasive manner, discussed at least overnight hospital stay, possibly 2-3 nights.  The patient needs a refill on her narcotic medication.  We will send this to cover her from now until surgery.  We had a frank discussion today about the fact that she likely has dependence to  opioids given her long-term use.  We will have to work to wean her off of narcotics after surgery.  This may benefit from having her be seen in a pain management clinic.  May require use of long acting agent such as methadone. Patient is understanding of this discussion and is very motivated to get off the narcotics after surgery.   Given vulvar pruritus and symptoms consistent with a yeast infection, dose of Diflucan sent to the patient's pharmacy today.  Referral placed to social work today given the emotional and physical toll that patient's current condition has taken.  She has not been able to work because of her pain.  38 minutes of total time was spent for this patient encounter, including preparation, face-to-face counseling with the patient and coordination of care, and documentation of the encounter.  Jeral Pinch, MD  Division of Gynecologic Oncology  Department of Obstetrics and Gynecology  [facility]

## 2022-10-09 NOTE — Telephone Encounter (Signed)
Pt is aware of Dr. Charisse March message regarding the Rx for Oxycodone. She states an understanding and was thankful for the explanation.

## 2022-10-09 NOTE — Telephone Encounter (Signed)
Left a message for the referral coordinator - Lessie Dings [phone] x [phone]) at [facility] for chronic pain management referral.

## 2022-10-09 NOTE — Telephone Encounter (Signed)
Merry Proud, pharmacist at [facility], aware, per Dr. Berline Lopes, our office is not giving the ok to release the Rx for Oxycodone until next Thursday 2/15. Merry Proud voiced an understanding.

## 2022-10-09 NOTE — Telephone Encounter (Signed)
Pt calls stating we need to call the pharmacy [facility] and ok for them to give her the Oxycodone for her upcoming surgery. The pharmacist told her they can not give her any until Thursday next week (2/15).   I called the pharmacy and spoke to [facility] (pharmacist) and he states pt takes Oxycodone routinely and on 1/18 she received 120 tablets from [facility]. He can not give her anymore until Thursday 2/15 unless we ok it to be released before then.  Dr.Tucker notified

## 2022-10-09 NOTE — Telephone Encounter (Signed)
She has an active prescription with [facility], which is different than what she told me. Her prescription is for 4 weeks. So she should not need a refill until next Thursday. Could you ask her for clarification of this?

## 2022-10-09 NOTE — Progress Notes (Signed)
Gynecologic Oncology Return Clinic Visit  10/09/22  Reason for Visit:  follow-up, treatment planning   Treatment History: Patient presented in July 2023 after noticing abnormal uterine bleeding and abdominal fullness in the past year that has worsened with time.  This was initially noticing that she felt something when she was laying on her belly.  With time, she noticed increasing abdominal girth and that her clothes were fitting differently.  She endorses a long history of heavy bleeding with frequent passage of clots during her menses; menses would typically be painful and last approximately 1 week.  Her menses were regular though, without any intermenstrual bleeding or spotting.  She received a Depo-Provera shot on 01/19/2022 to help with bleeding and was prescribed oral progesterone in May after she developed abnormal uterine bleeding.  She describes this is daily bleeding with intermittent pain that comes and goes.  If she is stressed or more active, she will have heavier bleeding.  She wears 2 thick pads at a time and change them about every hour and a half, noting that they are saturated.  When she urinates, she will pass up to plum sized clots.  Patient was seen recently in the setting of her enlarged fibroid uterus and abnormal bleeding, was given prescription for Lysteda.  This was too expensive and patient has not been able to have it filled.   01/04/2022: CT of the abdomen and pelvis shows enlarged myomatous uterus with multiple fibroids.  Uterus measures approximately 23 cm in craniocaudal length with a large fundal fibroid extending into the abdomen and displacing bowel loops and causing some compression of the IVC.  The largest exophytic fundal fibroid measures approximately 21 x 15 x 20 cm.  No adenopathy, no free fluid, no evidence of carcinomatosis.   On 03/09/2022, she underwent endometrial biopsy, which showed minute strips of inactive endometrium mixed with blood and normal endocervical  tissue.  Negative for hyperplasia or malignancy.   04/09/22: Pap NIML, HR HPV negative   04/24/2022: MRI abdomen/pelvis: Large mass extending from the pelvis into the mid abdomen measuring 24 x 24 x 15 cm, appears to extend from the uterine fundus and most likely reflects a uterine fibroid.  Given the size and complexity of this lesion, a uterine leiomyosarcoma is on the differential.  Enlarged uterus with multiple additional uterine masses, likely reflecting uterine fibroids.  Ovaries not confidently identified on this exam.   Patient seen by [facility] given her desire to preserve uterus if at all possible.  Plan had been for open myomectomy but after review of MRI, recommendation was made to proceed with definitive surgery.  Patient met with interventional radiology on 2/6 to discuss preoperative embolization.  Interval History: Patient reports that she is overall doing well although has continued to have more pain related to her large fibroid.  Bleeding has decreased and overall is darker in appearance and looks more like old blood rather than bright red blood.  She is continuing to use oxycodone regularly for pain.  Thinks that she has developed a yeast infection.  She continues to struggle with constipation.  Uses Ex-Lax and laxative intermittently.  Reports having 1 bowel movement a week.  Continues to struggle with appetite and bloating/early satiety.  Endorses urinary frequency.  Due to pain, had to stop working.  Has lost her car.  Past Medical/Surgical History: Past Medical History:  Diagnosis Date   Anxiety    Arthritis    Depression    Drug dependence ([place])  Tooth infection 01/28/2022    Past Surgical History:  Procedure Laterality Date   ENDOMETRIAL BIOPSY  03/09/2022   IR RADIOLOGIST EVAL & MGMT  10/06/2022   LIPOMA EXCISION  2009   on forehead    Family History  Problem Relation Age of Onset   Bone cancer Maternal Aunt    Colon cancer Neg Hx    Breast cancer Neg Hx     Ovarian cancer Neg Hx    Endometrial cancer Neg Hx    Pancreatic cancer Neg Hx    Prostate cancer Neg Hx     Social History   Socioeconomic History   Marital status: Single    Spouse name: Not on file   Number of children: Not on file   Years of education: Not on file   Highest education level: Not on file  Occupational History   Occupation: on leave from work  Tobacco Use   Smoking status: Every Day    Packs/day: 0.50    Years: 18.00    Total pack years: 9.00    Types: Cigarettes   Smokeless tobacco: Never  Vaping Use   Vaping Use: Never used  Substance and Sexual Activity   Alcohol use: Not Currently    Alcohol/week: 4.0 standard drinks of alcohol    Types: 4 Cans of beer per week    Comment: weekly   Drug use: Yes    Types: Other-see comments    Comment: CBD eatables   Sexual activity: Not Currently  Other Topics Concern   Not on file  Social History Narrative   Not on file   Social Determinants of Health   Financial Resource Strain: Not on file  Food Insecurity: Not on file  Transportation Needs: Not on file  Physical Activity: Not on file  Stress: Not on file  Social Connections: Not on file    Current Medications:  Current Outpatient Medications:    fluconazole (DIFLUCAN) 150 MG tablet, Take 1 tablet (150 mg total) by mouth daily., Disp: 1 tablet, Rfl: 1   polyethylene glycol (MIRALAX) 17 g packet, Take 17 g by mouth daily., Disp: 14 each, Rfl: 2   senna-docusate (SENOKOT-S) 8.6-50 MG tablet, Take 2 tablets by mouth at bedtime. For AFTER surgery, do not take if having diarrhea, Disp: 30 tablet, Rfl: 1   celecoxib (CELEBREX) 200 MG capsule, Take 1 capsule (200 mg total) by mouth 2 (two) times daily. (Patient not taking: Reported on 10/06/2022), Disp: 60 capsule, Rfl: 2   docusate sodium (COLACE) 100 MG capsule, Take 100 mg by mouth daily., Disp: , Rfl:    ondansetron (ZOFRAN) 4 MG tablet, Take 1 tablet (4 mg total) by mouth every 8 (eight) hours as needed  for nausea or vomiting., Disp: 30 tablet, Rfl: 1   oxyCODONE (OXY IR/ROXICODONE) 5 MG immediate release tablet, Take 1 tablet (5 mg total) by mouth every 4 (four) hours as needed for severe pain. Only take for severe pain, Do not take and drive, do not take with other pain medications, Disp: 30 tablet, Rfl: 0   sertraline (ZOLOFT) 25 MG tablet, Take 25 mg by mouth at bedtime., Disp: , Rfl:    triamcinolone cream (KENALOG) 0.1 %, Apply 1 Application topically 2 (two) times daily., Disp: 30 g, Rfl: 0  Review of Systems: + Decreased appetite, fatigue, bloating, abdominal pain, constipation, frequency, hot flashes, pelvic pain, back pain, muscle pain/cramp, itch, anxiety, depression. Denies fevers, chills, unexplained weight changes. Denies hearing loss, neck lumps or masses,  mouth sores, ringing in ears or voice changes. Denies cough or wheezing.  Denies shortness of breath. Denies chest pain or palpitations. Denies leg swelling. Denies blood in stools, diarrhea, nausea, vomiting, or early satiety. Denies pain with intercourse, dysuria, hematuria or incontinence. Denies joint pain. Denies rash, or wounds. Denies dizziness, headaches, numbness or seizures. Denies swollen lymph nodes or glands, denies easy bruising or bleeding. Denies confusion or decreased concentration.  Physical Exam: BP 132/75 (BP Location: Right Arm, Patient Position: Sitting)   Pulse (!) 104   Temp 99.2 F (37.3 C) (Oral)   Resp 18   Ht 5' 8"$  (1.727 m)   Wt 257 lb 12.8 oz (116.9 kg)   LMP  (LMP Unknown)   SpO2 100%   BMI 39.20 kg/m  General: Alert, oriented, no acute distress. HEENT: Normocephalic, atraumatic, sclera anicteric. Chest: Clear to auscultation bilaterally.  No wheezes or rhonchi. Cardiovascular: Regular rate and rhythm, no murmurs. Abdomen: Obese. Normoactive bowel sounds. Enlarged mass within the mid and upper abdomen, palpated approximately 8 cm above the umbilicus, firm, mass mostly in the midline  and to the right.  Minimal mobility due to its size. No palpable fluid wave.  Extremities: Grossly normal range of motion.  Warm, well perfused.  No edema bilaterally.  Laboratory & Radiologic Studies:    Latest Ref Rng & Units 10/08/2022   10:03 AM 07/14/2022    6:47 PM 04/09/2022   11:25 AM  CBC  WBC 4.0 - 10.5 K/uL 10.4   9.7   Hemoglobin 12.0 - 15.0 g/dL 10.4  14.1  13.8   Hematocrit 36.0 - 46.0 % 33.1  43.4  38.9   Platelets 150 - 400 K/uL 496   272       Latest Ref Rng & Units 10/08/2022   10:03 AM 01/04/2022   10:35 PM 01/01/2022   10:56 AM  BMP  Glucose 70 - 99 mg/dL 76  84  103   BUN 6 - 20 mg/dL 10  9  16   $ Creatinine 0.44 - 1.00 mg/dL 0.98  0.92  1.02   Sodium 135 - 145 mmol/L 135  140  139   Potassium 3.5 - 5.1 mmol/L 3.7  3.9  4.0   Chloride 98 - 111 mmol/L 97  107  104   CO2 22 - 32 mmol/L 25  24  27   $ Calcium 8.9 - 10.3 mg/dL 9.0  9.4  8.9    Assessment & Plan: Megan [name] is a 37 y.o. woman with enlarged fibroid uterus.   We reviewed discussed plan to move forward with definitive surgery.  Patient has spent much time considering her options and is understanding of the fact that she will likely lose her uterus.  She has excepted this and would like to move forward with surgery.  Plan will be for total hysterectomy with bilateral salpingectomy.  If the fibroid appears more pedunculated and has a stalk that I think can be safely transected, we discussed the possibility of myomectomy with preservation of the uterus.  Uterus versus fibroid will be sent for frozen section.  If benign, we will plan to take bilateral fallopian tubes and leave both ovaries in situ.  If malignant, depending on suspected histology, we will make a decision IntraOp whether safe to leave bilateral ovaries. We discussed the unlikely scenario that this mass represents a large solid ovarian mass.  If this were the case, I could proceed with unilateral oophorectomy and bilateral salpingectomy.  The mass  would  still be sent for frozen section and decisions about the uterus and contralateral ovary would be made based on frozen section.  We discussed that in the setting of a malignancy, whether uterine or ovarian, additional procedures may need to be performed including lymph node sampling, peritoneal biopsies, and omentectomy.  The patient was consented for a diagnostic laparoscopy, open versus robotic assisted hysterectomy, possible myomectomy, bilateral salpingectomy, possible unilateral versus bilateral oophorectomy, possible staging. The risks of surgery were discussed in detail and she understands these to include infection; wound separation; hernia; vaginal cuff separation, injury to adjacent organs such as bowel, bladder, blood vessels, ureters and nerves; bleeding which may require blood transfusion; anesthesia risk; thromboembolic events; possible death; unforeseen complications; possible need for re-exploration; medical complications such as heart attack, stroke, pleural effusion and pneumonia; and, if full lymphadenectomy is performed the risk of lymphedema and lymphocyst. The patient will receive DVT and antibiotic prophylaxis as indicated. She voiced a clear understanding. She had the opportunity to ask questions. Perioperative instructions were reviewed with her. Prescriptions for post-op medications were sent to her pharmacy of choice.  Despite heavy bleeding, the patient has not been anemic until recent blood work.  Hemoglobin is just over 10.  Plan for preoperative embolization to decrease blood loss associated with surgery.  Given anticipated need for larger incision, even if the surgery can be done in a minimally invasive manner, discussed at least overnight hospital stay, possibly 2-3 nights.  The patient needs a refill on her narcotic medication.  We will send this to cover her from now until surgery.  We had a frank discussion today about the fact that she likely has dependence to  opioids given her long-term use.  We will have to work to wean her off of narcotics after surgery.  This may benefit from having her be seen in a pain management clinic.  May require use of long acting agent such as methadone. Patient is understanding of this discussion and is very motivated to get off the narcotics after surgery.   Given vulvar pruritus and symptoms consistent with a yeast infection, dose of Diflucan sent to the patient's pharmacy today.  Referral placed to social work today given the emotional and physical toll that patient's current condition has taken.  She has not been able to work because of her pain.  38 minutes of total time was spent for this patient encounter, including preparation, face-to-face counseling with the patient and coordination of care, and documentation of the encounter.  Jeral Pinch, MD  Division of Gynecologic Oncology  Department of Obstetrics and Gynecology  [facility]

## 2022-10-09 NOTE — Patient Instructions (Addendum)
Preparing for your Surgery  Plan for surgery on October 21, 2022 with Dr. Jeral Pinch at [facility] will be scheduled for diagnostic laparoscopy (looking into the abdomen with a camera through a small incision), robotic assisted laparoscopic versus open total hysterectomy (removal of uterus and cervix), possible myomectomy (removal of fibroid), bilateral salpingectomy (removal of both fallopian tubes), possible unilateral versus bilateral oophorectomy (removal of one or both ovaries), possible staging, mini laparotomy if done robotically (slightly larger incision if done through small incisions).   Plan for at least an overnight stay in the hospital.  You will be admitted the day before for uterine artery embolization with Interventional Radiology at [facility]. You will receive a phone call from their office to discuss time to arrive etc.  Pre-operative Testing -You will receive a phone call from presurgical testing at [facility] to arrange for a pre-operative appointment and lab work.  -Bring your insurance card, copy of an advanced directive if applicable, medication list  -At that visit, you will be asked to sign a consent for a possible blood transfusion in case a transfusion becomes necessary during surgery.  The need for a blood transfusion is rare but having consent is a necessary part of your care.     -You should not be taking blood thinners or aspirin at least ten days prior to surgery unless instructed by your surgeon.  -Do not take supplements such as fish oil (omega 3), red yeast rice, turmeric before your surgery. You want to avoid medications with aspirin in them including headache powders such as BC or Goody's), Excedrin migraine.  Plan to hold celebrex at least 7 days before surgery.   Day Before Surgery at [place] will be asked to take in a light diet the day before surgery. You will be advised you can have clear liquids up until 3 hours  before your surgery.    Eat a light diet the day before surgery.  Examples including soups, broths, toast, yogurt, mashed potatoes.  AVOID GAS PRODUCING FOODS AND BEVERAGES. Things to avoid include carbonated beverages (fizzy beverages, sodas), raw fruits and raw vegetables (uncooked), or beans.   If your bowels are filled with gas, your surgeon will have difficulty visualizing your pelvic organs which increases your surgical risks.  Your role in recovery Your role is to become active as soon as directed by your doctor, while still giving yourself time to heal.  Rest when you feel tired. You will be asked to do the following in order to speed your recovery:  - Cough and breathe deeply. This helps to clear and expand your lungs and can prevent pneumonia after surgery.  - [place]. Do mild physical activity. Walking or moving your legs help your circulation and body functions return to normal. Do not try to get up or walk alone the first time after surgery.   -If you develop swelling on one leg or the other, pain in the back of your leg, redness/warmth in one of your legs, please call the office or go to the Emergency Room to have a doppler to rule out a blood clot. For shortness of breath, chest pain-seek care in the Emergency Room as soon as possible. - Actively manage your pain. Managing your pain lets you move in comfort. We will ask you to rate your pain on a scale of zero to 10. It is your responsibility to tell your doctor or nurse where and how  much you hurt so your pain can be treated.  Special Considerations -If you are diabetic, you may be placed on insulin after surgery to have closer control over your blood sugars to promote healing and recovery.  This does not mean that you will be discharged on insulin.  If applicable, your oral antidiabetics will be resumed when you are tolerating a solid diet.  -Your final pathology results from surgery should be available around  one week after surgery and the results will be relayed to you when available.  -FMLA forms can be faxed to [phone] and please allow 5-7 business days for completion.  Pain Management After Surgery -Make sure that you have Tylenol and Ibuprofen IF YOU ARE ABLE TO TAKE THESE MEDICATIONS at home to use on a regular basis after surgery for pain control. We recommend alternating the medications every hour to six hours since they work differently and are processed in the body differently for pain relief.  -Review the attached handout on narcotic use and their risks and side effects.   Bowel Regimen -You will be prescribed Sennakot-S to take nightly to prevent constipation especially if you are taking the narcotic pain medication intermittently.  It is important to prevent constipation and drink adequate amounts of liquids. You can stop taking this medication when you are not taking pain medication and you are back on your normal bowel routine.  Risks of Surgery Risks of surgery are low but include bleeding, infection, damage to surrounding structures, re-operation, blood clots, and very rarely death.   Blood Transfusion Information (For the consent to be signed before surgery)  We will be checking your blood type before surgery so in case of emergencies, we will know what type of blood you would need.                                            WHAT IS A BLOOD TRANSFUSION?  A transfusion is the replacement of blood or some of its parts. Blood is made up of multiple cells which provide different functions. Red blood cells carry oxygen and are used for blood loss replacement. White blood cells fight against infection. Platelets control bleeding. Plasma helps clot blood. Other blood products are available for specialized needs, such as hemophilia or other clotting disorders. BEFORE THE TRANSFUSION  Who gives blood for transfusions?  You may be able to donate blood to be used at a later date  on yourself (autologous donation). Relatives can be asked to donate blood. This is generally not any safer than if you have received blood from a stranger. The same precautions are taken to ensure safety when a relative's blood is donated. Healthy volunteers who are fully evaluated to make sure their blood is safe. This is blood bank blood. Transfusion therapy is the safest it has ever been in the practice of medicine. Before blood is taken from a donor, a complete history is taken to make sure that person has no history of diseases nor engages in risky social behavior (examples are intravenous drug use or sexual activity with multiple partners). The donor's travel history is screened to minimize risk of transmitting infections, such as malaria. The donated blood is tested for signs of infectious diseases, such as HIV and hepatitis. The blood is then tested to be sure it is compatible with you in order to minimize the chance of a  transfusion reaction. If you or a relative donates blood, this is often done in anticipation of surgery and is not appropriate for emergency situations. It takes many days to process the donated blood. RISKS AND COMPLICATIONS Although transfusion therapy is very safe and saves many lives, the main dangers of transfusion include:  Getting an infectious disease. Developing a transfusion reaction. This is an allergic reaction to something in the blood you were given. Every precaution is taken to prevent this. The decision to have a blood transfusion has been considered carefully by your caregiver before blood is given. Blood is not given unless the benefits outweigh the risks.  AFTER SURGERY INSTRUCTIONS  Return to work: 4-6 weeks if applicable  Activity: 1. Be up and out of the bed during the day.  Take a nap if needed.  You may walk up steps but be careful and use the hand rail.  Stair climbing will tire you more than you think, you may need to stop part way and rest.   2.  No lifting or straining for 6 weeks over 10 pounds. No pushing, pulling, straining for 6 weeks.  3. No driving for around 1-2 week(s).  Do not drive if you are taking narcotic pain medicine and make sure that your reaction time has returned.   4. You can shower as soon as the next day after surgery. Shower daily.  Use your regular soap and water (not directly on the incision) and pat your incision(s) dry afterwards; don't rub.  No tub baths or submerging your body in water until cleared by your surgeon. If you have the soap that was given to you by pre-surgical testing that was used before surgery, you do not need to use it afterwards because this can irritate your incisions.   5. No sexual activity and nothing in the vagina for 12 weeks.  6. You may experience a small amount of clear drainage from your incisions, which is normal.  If the drainage persists, increases, or changes color please call the office.  7. Do not use creams, lotions, or ointments such as neosporin on your incisions after surgery until advised by your surgeon because they can cause removal of the dermabond glue on your incisions.    8. You may experience vaginal spotting after surgery or around the 6-8 week mark from surgery when the stitches at the top of the vagina begin to dissolve.  The spotting is normal but if you experience heavy bleeding, call our office.  9. Take Tylenol or ibuprofen first for pain if you are able to take these medications and only use narcotic pain medication for severe pain not relieved by the Tylenol or Ibuprofen.  Monitor your Tylenol intake to a max of 4,000 mg in a 24 hour period. You can alternate these medications after surgery.  Diet: 1. Low sodium Heart Healthy Diet is recommended but you are cleared to resume your normal (before surgery) diet after your procedure.  2. It is safe to use a laxative, such as Miralax or Colace, if you have difficulty moving your bowels. You have been  prescribed Sennakot-S to take at bedtime every evening after surgery to keep bowel movements regular and to prevent constipation.    Wound Care: 1. Keep clean and dry.  Shower daily.  Reasons to call the Doctor: Fever - Oral temperature greater than 100.4 degrees Fahrenheit Foul-smelling vaginal discharge Difficulty urinating Nausea and vomiting Increased pain at the site of the incision that is unrelieved with  pain medicine. Difficulty breathing with or without chest pain New calf pain especially if only on one side Sudden, continuing increased vaginal bleeding with or without clots.   Contacts: For questions or concerns you should contact:  Dr. Jeral Pinch at [phone]  Joylene John, NP at [phone]  After Hours: call [phone] and have the GYN Oncologist paged/contacted (after 5 pm or on the weekends).  Messages sent via mychart are for non-urgent matters and are not responded to after hours so for urgent needs, please call the after hours number.

## 2022-10-09 NOTE — Progress Notes (Signed)
Patient here for follow up with Dr. Jeral Pinch and for a pre-operative appointment prior to her scheduled surgery on October 21, 2022. She is scheduled for diagnostic laparoscopy, robotic assisted laparoscopic versus open total hysterectomy, possible myomectomy, bilateral salpingectomy, possible unilateral versus bilateral oophorectomy, possible staging, mini laparotomy if done robotically. She had her pre-admission testing appointment at [facility].  The surgery was discussed in detail.  See after visit summary for additional details. Visual aids used to discuss items related to surgery.   Discussed post-op pain management in detail including the aspects of the enhanced recovery pathway. She is on chronic pain medication, last filled with [facility]. We discussed the use of tylenol post-op and to monitor for a maximum of 4,000 mg in a 24 hour period.  Also prescribed sennakot to be used after surgery and to hold if having loose stools.  Discussed bowel regimen in detail.     Discussed the use of SCDs and measures to take at home to prevent DVT including frequent mobility.  Reportable signs and symptoms of DVT discussed. Post-operative instructions discussed and expectations for after surgery. Incisional care discussed as well including reportable signs and symptoms including erythema, drainage, wound separation.     10 minutes spent with the patient and preparing information.  Verbalizing understanding of material discussed. No needs or concerns voiced at the end of the visit.  Advised patient to call for any needs. Advised she would be contact by IR with additional information about the [place].   This appointment is included in the global surgical bundle as pre-operative teaching and has no charge.

## 2022-10-12 ENCOUNTER — Other Ambulatory Visit (HOSPITAL_COMMUNITY): Payer: Self-pay | Admitting: Interventional Radiology

## 2022-10-12 ENCOUNTER — Telehealth: Payer: Self-pay | Admitting: Oncology

## 2022-10-12 ENCOUNTER — Encounter: Payer: Self-pay | Admitting: Gynecologic Oncology

## 2022-10-12 DIAGNOSIS — D259 Leiomyoma of uterus, unspecified: Secondary | ICD-10-CM

## 2022-10-12 NOTE — Telephone Encounter (Signed)
Left a message for the new patient coordinator at [facility].  Requested a return call.

## 2022-10-13 ENCOUNTER — Other Ambulatory Visit: Payer: Self-pay | Admitting: Gynecologic Oncology

## 2022-10-13 ENCOUNTER — Encounter: Payer: Self-pay | Admitting: Gynecologic Oncology

## 2022-10-13 ENCOUNTER — Telehealth: Payer: Self-pay | Admitting: Oncology

## 2022-10-13 ENCOUNTER — Telehealth: Payer: Self-pay

## 2022-10-13 ENCOUNTER — Inpatient Hospital Stay: Payer: Medicaid Other | Admitting: Licensed Clinical Social Worker

## 2022-10-13 DIAGNOSIS — D219 Benign neoplasm of connective and other soft tissue, unspecified: Secondary | ICD-10-CM

## 2022-10-13 DIAGNOSIS — R1030 Lower abdominal pain, unspecified: Secondary | ICD-10-CM

## 2022-10-13 MED ORDER — OXYCODONE HCL 5 MG PO TABS: 5.0000 mg | ORAL_TABLET | ORAL | 0 refills | Status: DC | PRN

## 2022-10-13 NOTE — Progress Notes (Signed)
[place] Work  Initial Assessment   Megan [name] is a 37 y.o. year old female contacted by phone. Clinical Social Work was referred by medical provider for assessment of psychosocial needs.   SDOH (Social Determinants of Health) assessments performed: Yes SDOH Interventions    Flowsheet Row Clinical Support from 10/13/2022 in [place] at [facility] Office Visit from 07/02/2022 in [place] at [place] Interventions Assist with SNAP Application --  Housing Interventions Intervention Not Indicated --  Depression Interventions/Treatment  -- Medication  Financial Strain Interventions Other (Comment)  [insurance benefit, SNAP] --       SDOH Screenings   Food Insecurity: Food Insecurity Present (10/13/2022)  Housing: Low Risk  (10/13/2022)  Utilities: Not At Risk (10/13/2022)  Depression (PHQ2-9): High Risk (05/14/2022)  Financial Resource Strain: High Risk (10/13/2022)  Tobacco Use: High Risk (10/09/2022)     Distress Screen completed: No     No data to display            Family/Social Information:  Housing Arrangement: patient lives alone in [place] members/support persons in your life? Family Transportation concerns: yes, car was repossessed  Employment: Out of work due to pain - was working for [employer].  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 yes, ready for surgery- hopeful that it will help her pain and she can eventually return to her normal activities and work Concerns about diagnosis and/or  treatment:  paying for basic needs Patient reported stressors: Finances Current coping skills/ strengths: Capable of independent living  and Other: able to connect to community resources    SUMMARY: Current SDOH Barriers:  Financial constraints related to being out of work due to pain Mental health decline due to physical issues and lack of emotional support  Clinical Social Work Clinical Goal(s):  Explore community resource options for unmet needs related to:  Museum/gallery curator Strain  and Depression    Interventions: Discussed common feeling and emotions when dealing with pain, and the importance of support during treatment Informed patient of the support team roles and support services at [facility] Provided CSW contact information and encouraged patient to call with any questions or concerns Discussed potential avenues for financial support and encouraged pt to contact [facility] Medicaid re:  housing allowance and to apply for SNAP benefits. Pt is aware of [employer], but may not qualify with no income   Follow Up Plan: CSW will follow-up with patient by phone the week after surgery to check on coping Patient verbalizes understanding of plan: Yes    Mardell Suttles E Riordan Walle, LCSW   Patient is participating in a Managed Medicaid Plan:  Yes

## 2022-10-13 NOTE — Telephone Encounter (Signed)
Faxed urgent referral for pain management to [facility] at [facility] - [phone].

## 2022-10-13 NOTE — Telephone Encounter (Signed)
Left a message for Estill Bamberg at [facility] regarding status of referral.  Requested a return call.

## 2022-10-13 NOTE — Telephone Encounter (Signed)
Megan [name] called back and has received the referral.  She will reach out to [place] this afternoon or tomorrow morning to schedule.

## 2022-10-13 NOTE — Progress Notes (Signed)
Follow up mychart message sent to the patient from Dr. Berline Lopes. Per Dr. Berline Lopes verbal order, script sent in for oxycodone #60 to use every three hours as needed up until her procedure on 2/20-2/21. The original oxycodone script sent in on 10/13/22 has been placed on hold with Krist[facility] Oppenheim, not to be filled until after surgery starting on 2/22. Referral to pain clinic has been faxed.

## 2022-10-13 NOTE — Telephone Encounter (Signed)
Per Dr.Tucker, I called [facility], the office of Dr.Amy Broach, and sopke to Raquel Sarna RN for Dr.Broach,to let them know Megan [name] will not need refills on Oxycodone. She is getting that filled by our office for upcoming surgery on 2/21.  Raquel Sarna stated she will note this in pt's chart and make Dr. Clide Dales aware.

## 2022-10-16 ENCOUNTER — Ambulatory Visit: Payer: Medicaid Other | Admitting: Family Medicine

## 2022-10-16 DIAGNOSIS — Z6841 Body Mass Index (BMI) 40.0 and over, adult: Secondary | ICD-10-CM | POA: Diagnosis not present

## 2022-10-16 DIAGNOSIS — F419 Anxiety disorder, unspecified: Secondary | ICD-10-CM | POA: Diagnosis not present

## 2022-10-19 ENCOUNTER — Other Ambulatory Visit: Payer: Self-pay | Admitting: Radiology

## 2022-10-19 ENCOUNTER — Other Ambulatory Visit: Payer: Self-pay | Admitting: Student

## 2022-10-19 DIAGNOSIS — D219 Benign neoplasm of connective and other soft tissue, unspecified: Secondary | ICD-10-CM

## 2022-10-20 ENCOUNTER — Other Ambulatory Visit: Payer: Self-pay

## 2022-10-20 ENCOUNTER — Inpatient Hospital Stay (HOSPITAL_COMMUNITY)
Admission: AD | Admit: 2022-10-20 | Discharge: 2022-10-23 | DRG: 742 | Disposition: A | Discharge status: Home / Self Care | Payer: BLUE CROSS/BLUE SHIELD | Attending: Gynecologic Oncology | Admitting: Gynecologic Oncology

## 2022-10-20 ENCOUNTER — Ambulatory Visit (HOSPITAL_COMMUNITY)
Admission: RE | Admit: 2022-10-20 | Discharge: 2022-10-20 | Disposition: A | Discharge status: Home / Self Care | Payer: BLUE CROSS/BLUE SHIELD | Source: Ambulatory Visit | Attending: Interventional Radiology | Admitting: Interventional Radiology

## 2022-10-20 ENCOUNTER — Other Ambulatory Visit (HOSPITAL_COMMUNITY): Payer: Self-pay | Admitting: Interventional Radiology

## 2022-10-20 ENCOUNTER — Encounter (HOSPITAL_COMMUNITY): Payer: Self-pay

## 2022-10-20 VITALS — BP 133/78 | HR 87 | Temp 98.8°F | Resp 16 | Ht 68.0 in | Wt 257.8 lb

## 2022-10-20 DIAGNOSIS — F418 Other specified anxiety disorders: Secondary | ICD-10-CM | POA: Diagnosis not present

## 2022-10-20 DIAGNOSIS — D219 Benign neoplasm of connective and other soft tissue, unspecified: Secondary | ICD-10-CM | POA: Diagnosis not present

## 2022-10-20 DIAGNOSIS — N921 Excessive and frequent menstruation with irregular cycle: Secondary | ICD-10-CM

## 2022-10-20 DIAGNOSIS — I96 Gangrene, not elsewhere classified: Secondary | ICD-10-CM | POA: Diagnosis not present

## 2022-10-20 DIAGNOSIS — B3731 Acute candidiasis of vulva and vagina: Secondary | ICD-10-CM | POA: Diagnosis not present

## 2022-10-20 DIAGNOSIS — N938 Other specified abnormal uterine and vaginal bleeding: Secondary | ICD-10-CM | POA: Diagnosis not present

## 2022-10-20 DIAGNOSIS — D259 Leiomyoma of uterus, unspecified: Secondary | ICD-10-CM

## 2022-10-20 DIAGNOSIS — F32A Depression, unspecified: Secondary | ICD-10-CM | POA: Diagnosis not present

## 2022-10-20 DIAGNOSIS — R739 Hyperglycemia, unspecified: Secondary | ICD-10-CM | POA: Diagnosis not present

## 2022-10-20 DIAGNOSIS — N852 Hypertrophy of uterus: Secondary | ICD-10-CM | POA: Diagnosis not present

## 2022-10-20 DIAGNOSIS — F17210 Nicotine dependence, cigarettes, uncomplicated: Secondary | ICD-10-CM | POA: Diagnosis present

## 2022-10-20 DIAGNOSIS — M1990 Unspecified osteoarthritis, unspecified site: Secondary | ICD-10-CM | POA: Diagnosis present

## 2022-10-20 DIAGNOSIS — Z79899 Other long term (current) drug therapy: Secondary | ICD-10-CM

## 2022-10-20 DIAGNOSIS — F1920 Other psychoactive substance dependence, uncomplicated: Secondary | ICD-10-CM | POA: Diagnosis not present

## 2022-10-20 DIAGNOSIS — K5900 Constipation, unspecified: Secondary | ICD-10-CM | POA: Diagnosis present

## 2022-10-20 DIAGNOSIS — Z808 Family history of malignant neoplasm of other organs or systems: Secondary | ICD-10-CM | POA: Diagnosis not present

## 2022-10-20 DIAGNOSIS — L299 Pruritus, unspecified: Secondary | ICD-10-CM | POA: Diagnosis present

## 2022-10-20 DIAGNOSIS — F419 Anxiety disorder, unspecified: Secondary | ICD-10-CM | POA: Diagnosis present

## 2022-10-20 DIAGNOSIS — N920 Excessive and frequent menstruation with regular cycle: Secondary | ICD-10-CM | POA: Diagnosis present

## 2022-10-20 DIAGNOSIS — D62 Acute posthemorrhagic anemia: Secondary | ICD-10-CM | POA: Diagnosis not present

## 2022-10-20 DIAGNOSIS — K660 Peritoneal adhesions (postprocedural) (postinfection): Secondary | ICD-10-CM | POA: Diagnosis not present

## 2022-10-20 DIAGNOSIS — N951 Menopausal and female climacteric states: Secondary | ICD-10-CM | POA: Diagnosis present

## 2022-10-20 DIAGNOSIS — Z880 Allergy status to penicillin: Secondary | ICD-10-CM

## 2022-10-20 DIAGNOSIS — Z6839 Body mass index (BMI) 39.0-39.9, adult: Secondary | ICD-10-CM

## 2022-10-20 DIAGNOSIS — Z885 Allergy status to narcotic agent status: Secondary | ICD-10-CM | POA: Diagnosis not present

## 2022-10-20 DIAGNOSIS — K219 Gastro-esophageal reflux disease without esophagitis: Secondary | ICD-10-CM | POA: Diagnosis not present

## 2022-10-20 HISTORY — PX: IR EMBO TUMOR ORGAN ISCHEMIA INFARCT INC GUIDE ROADMAPPING: IMG5449

## 2022-10-20 HISTORY — PX: IR 3D INDEPENDENT WKST: IMG2385

## 2022-10-20 HISTORY — PX: IR ANGIOGRAM PELVIS SELECTIVE OR SUPRASELECTIVE: IMG661

## 2022-10-20 HISTORY — PX: IR US GUIDE VASC ACCESS LEFT: IMG2389

## 2022-10-20 HISTORY — PX: IR ANGIOGRAM SELECTIVE EACH ADDITIONAL VESSEL: IMG667

## 2022-10-20 LAB — PROTIME-INR
INR: 1 (ref 0.8–1.2)
Prothrombin Time: 13.4 seconds (ref 11.4–15.2)

## 2022-10-20 LAB — CBC WITH DIFFERENTIAL/PLATELET
Abs Immature Granulocytes: 0.05 10*3/uL (ref 0.00–0.07)
Basophils Absolute: 0.1 10*3/uL (ref 0.0–0.1)
Basophils Relative: 1 %
Eosinophils Absolute: 0.2 10*3/uL (ref 0.0–0.5)
Eosinophils Relative: 2 %
HCT: 35.7 % — ABNORMAL LOW (ref 36.0–46.0)
Hemoglobin: 10.9 g/dL — ABNORMAL LOW (ref 12.0–15.0)
Immature Granulocytes: 1 %
Lymphocytes Relative: 19 %
Lymphs Abs: 2 10*3/uL (ref 0.7–4.0)
MCH: 25.2 pg — ABNORMAL LOW (ref 26.0–34.0)
MCHC: 30.5 g/dL (ref 30.0–36.0)
MCV: 82.6 fL (ref 80.0–100.0)
Monocytes Absolute: 0.5 10*3/uL (ref 0.1–1.0)
Monocytes Relative: 5 %
Neutro Abs: 7.8 10*3/uL — ABNORMAL HIGH (ref 1.7–7.7)
Neutrophils Relative %: 72 %
Platelets: 556 10*3/uL — ABNORMAL HIGH (ref 150–400)
RBC: 4.32 MIL/uL (ref 3.87–5.11)
RDW: 15 % (ref 11.5–15.5)
WBC: 10.6 10*3/uL — ABNORMAL HIGH (ref 4.0–10.5)
nRBC: 0 % (ref 0.0–0.2)

## 2022-10-20 LAB — BASIC METABOLIC PANEL
Anion gap: 15 (ref 5–15)
BUN: 11 mg/dL (ref 6–20)
CO2: 24 mmol/L (ref 22–32)
Calcium: 9.3 mg/dL (ref 8.9–10.3)
Chloride: 100 mmol/L (ref 98–111)
Creatinine, Ser: 1.09 mg/dL — ABNORMAL HIGH (ref 0.44–1.00)
GFR, Estimated: >60 mL/min (ref >60)
Glucose, Bld: 103 mg/dL — ABNORMAL HIGH (ref 70–99)
Potassium: 4 mmol/L (ref 3.5–5.1)
Sodium: 139 mmol/L (ref 135–145)

## 2022-10-20 LAB — HCG, SERUM, QUALITATIVE: Preg, Serum: NEGATIVE

## 2022-10-20 MED ORDER — DIPHENHYDRAMINE HCL 50 MG/ML IJ SOLN
INTRAMUSCULAR | Status: DC | PRN
  Administered 2022-10-20: 50 mg via INTRAVENOUS

## 2022-10-20 MED ORDER — ACETAMINOPHEN 500 MG PO TABS
1000.0000 mg | ORAL_TABLET | ORAL | Status: AC
  Administered 2022-10-21: 1000 mg via ORAL
  Filled 2022-10-20: qty 2

## 2022-10-20 MED ORDER — LIDOCAINE HCL (PF) 1 % IJ SOLN
INTRAMUSCULAR | Status: AC
  Administered 2022-10-20: 3 mL
  Filled 2022-10-20: qty 30

## 2022-10-20 MED ORDER — HYDROMORPHONE HCL 2 MG/ML IJ SOLN
INTRAMUSCULAR | Status: AC
  Filled 2022-10-20: qty 1

## 2022-10-20 MED ORDER — NALOXONE HCL 0.4 MG/ML IJ SOLN: 0.4000 mg | INTRAMUSCULAR | Status: DC | PRN

## 2022-10-20 MED ORDER — IOHEXOL 300 MG/ML  SOLN
100.0000 mL | Freq: Once | INTRAMUSCULAR | Status: DC | PRN
  Administered 2022-10-20: 65 mL via INTRA_ARTERIAL

## 2022-10-20 MED ORDER — CEFAZOLIN SODIUM-DEXTROSE 2-4 GM/100ML-% IV SOLN
2.0000 g | INTRAVENOUS | Status: AC
  Administered 2022-10-21 (×2): 2 g via INTRAVENOUS
  Filled 2022-10-20: qty 100

## 2022-10-20 MED ORDER — HEPARIN SODIUM (PORCINE) 5000 UNIT/ML IJ SOLN
5000.0000 [IU] | INTRAMUSCULAR | Status: AC
  Administered 2022-10-21: 5000 [IU] via SUBCUTANEOUS
  Filled 2022-10-20: qty 1

## 2022-10-20 MED ORDER — SERTRALINE HCL 25 MG PO TABS
25.0000 mg | ORAL_TABLET | Freq: Every day | ORAL | Status: DC
  Administered 2022-10-20: 25 mg via ORAL
  Filled 2022-10-20: qty 1

## 2022-10-20 MED ORDER — HYDROMORPHONE HCL 1 MG/ML IJ SOLN
INTRAMUSCULAR | Status: DC | PRN
  Administered 2022-10-20 (×5): 1 mg via INTRAVENOUS

## 2022-10-20 MED ORDER — SODIUM CHLORIDE 0.9 % IV SOLN: INTRAVENOUS | Status: DC

## 2022-10-20 MED ORDER — NITROGLYCERIN IN D5W 100-5 MCG/ML-% IV SOLN
INTRAVENOUS | Status: AC
  Filled 2022-10-20: qty 250

## 2022-10-20 MED ORDER — VANCOMYCIN HCL 1500 MG/300ML IV SOLN
1500.0000 mg | INTRAVENOUS | Status: AC
  Administered 2022-10-20: 1500 mg via INTRAVENOUS
  Filled 2022-10-20 (×2): qty 300

## 2022-10-20 MED ORDER — SODIUM CHLORIDE 0.9 % IV SOLN: 250.0000 mL | INTRAVENOUS | Status: DC | PRN

## 2022-10-20 MED ORDER — TRIAMCINOLONE ACETONIDE 0.1 % EX CREA
1.0000 | TOPICAL_CREAM | Freq: Two times a day (BID) | CUTANEOUS | Status: DC
  Filled 2022-10-20: qty 15

## 2022-10-20 MED ORDER — POVIDONE-IODINE 10 % EX SWAB
2.0000 | Freq: Once | CUTANEOUS | Status: AC
  Administered 2022-10-21: 2 via TOPICAL

## 2022-10-20 MED ORDER — SODIUM CHLORIDE 0.9 % IV SOLN: INTRAVENOUS | Status: AC

## 2022-10-20 MED ORDER — DIPHENHYDRAMINE HCL 50 MG/ML IJ SOLN
INTRAMUSCULAR | Status: AC
  Filled 2022-10-20: qty 1

## 2022-10-20 MED ORDER — HEPARIN SODIUM (PORCINE) 1000 UNIT/ML IJ SOLN
INTRAMUSCULAR | Status: AC
  Filled 2022-10-20: qty 10

## 2022-10-20 MED ORDER — SODIUM CHLORIDE 0.9% FLUSH: 9.0000 mL | INTRAVENOUS | Status: DC | PRN

## 2022-10-20 MED ORDER — ONDANSETRON HCL 4 MG/2ML IJ SOLN: 4.0000 mg | Freq: Four times a day (QID) | INTRAMUSCULAR | Status: DC | PRN

## 2022-10-20 MED ORDER — OXYCODONE HCL 5 MG PO TABS
5.0000 mg | ORAL_TABLET | ORAL | Status: DC | PRN
  Administered 2022-10-20 – 2022-10-21 (×3): 10 mg via ORAL
  Filled 2022-10-20 (×3): qty 2

## 2022-10-20 MED ORDER — VERAPAMIL HCL 2.5 MG/ML IV SOLN: INTRA_ARTERIAL | Status: DC | PRN

## 2022-10-20 MED ORDER — VERAPAMIL HCL 2.5 MG/ML IV SOLN
INTRAVENOUS | Status: AC
  Filled 2022-10-20: qty 2

## 2022-10-20 MED ORDER — DEXAMETHASONE SODIUM PHOSPHATE 10 MG/ML IJ SOLN
10.0000 mg | Freq: Once | INTRAMUSCULAR | Status: AC
  Administered 2022-10-20: 10 mg via INTRAVENOUS
  Filled 2022-10-20: qty 1

## 2022-10-20 MED ORDER — DIPHENHYDRAMINE HCL 50 MG/ML IJ SOLN: 12.5000 mg | Freq: Four times a day (QID) | INTRAMUSCULAR | Status: DC | PRN

## 2022-10-20 MED ORDER — IOHEXOL 300 MG/ML  SOLN
100.0000 mL | Freq: Once | INTRAMUSCULAR | Status: DC | PRN
  Administered 2022-10-20: 50 mL via INTRA_ARTERIAL

## 2022-10-20 MED ORDER — HYDROMORPHONE 1 MG/ML IV SOLN
INTRAVENOUS | Status: DC
  Administered 2022-10-20: 1 mg via INTRAVENOUS
  Administered 2022-10-20: 3 mg via INTRAVENOUS
  Administered 2022-10-21: 3.3 mg via INTRAVENOUS
  Administered 2022-10-21: 2.1 mg via INTRAVENOUS
  Filled 2022-10-20 (×2): qty 30

## 2022-10-20 MED ORDER — MIDAZOLAM HCL 2 MG/2ML IJ SOLN
INTRAMUSCULAR | Status: DC | PRN
  Administered 2022-10-20: 1.5 mg via INTRAVENOUS
  Administered 2022-10-20 (×2): 1 mg via INTRAVENOUS
  Administered 2022-10-20: .5 mg via INTRAVENOUS
  Administered 2022-10-20: 1 mg via INTRAVENOUS
  Administered 2022-10-20: 1.5 mg via INTRAVENOUS
  Administered 2022-10-20: .5 mg via INTRAVENOUS

## 2022-10-20 MED ORDER — SODIUM CHLORIDE 0.9% FLUSH: 3.0000 mL | INTRAVENOUS | Status: DC | PRN

## 2022-10-20 MED ORDER — POVIDONE-IODINE 10 % EX SWAB
2.0000 | Freq: Once | CUTANEOUS | Status: DC
  Filled 2022-10-20: qty 4

## 2022-10-20 MED ORDER — DIPHENHYDRAMINE HCL 12.5 MG/5ML PO ELIX: 12.5000 mg | ORAL_SOLUTION | Freq: Four times a day (QID) | ORAL | Status: DC | PRN

## 2022-10-20 MED ORDER — ONDANSETRON HCL 4 MG/2ML IJ SOLN
4.0000 mg | Freq: Once | INTRAMUSCULAR | Status: AC
  Administered 2022-10-20: 4 mg via INTRAVENOUS
  Filled 2022-10-20: qty 2

## 2022-10-20 MED ORDER — GABAPENTIN 100 MG PO CAPS
300.0000 mg | ORAL_CAPSULE | ORAL | Status: AC
  Administered 2022-10-21: 300 mg via ORAL
  Filled 2022-10-20: qty 3

## 2022-10-20 MED ORDER — KETOROLAC TROMETHAMINE 30 MG/ML IJ SOLN
INTRAMUSCULAR | Status: DC | PRN
  Administered 2022-10-20 (×2): 30 mg via INTRAVENOUS

## 2022-10-20 MED ORDER — MIDAZOLAM HCL 2 MG/2ML IJ SOLN
INTRAMUSCULAR | Status: AC
  Filled 2022-10-20: qty 6

## 2022-10-20 MED ORDER — HEPARIN SODIUM (PORCINE) 5000 UNIT/ML IJ SOLN: 5000.0000 [IU] | INTRAMUSCULAR | Status: DC

## 2022-10-20 MED ORDER — DEXTROSE-NACL 5-0.45 % IV SOLN: INTRAVENOUS | Status: DC

## 2022-10-20 MED ORDER — DEXAMETHASONE SODIUM PHOSPHATE 4 MG/ML IJ SOLN
4.0000 mg | INTRAMUSCULAR | Status: AC
  Administered 2022-10-21: 4 mg via INTRAVENOUS

## 2022-10-20 MED ORDER — FENTANYL CITRATE (PF) 100 MCG/2ML IJ SOLN
INTRAMUSCULAR | Status: AC
  Filled 2022-10-20: qty 4

## 2022-10-20 MED ORDER — ACETAMINOPHEN 10 MG/ML IV SOLN
1000.0000 mg | Freq: Once | INTRAVENOUS | Status: AC
  Administered 2022-10-20: 1000 mg via INTRAVENOUS
  Filled 2022-10-20: qty 100

## 2022-10-20 MED ORDER — HYDROMORPHONE HCL 1 MG/ML IJ SOLN: 0.5000 mg | INTRAMUSCULAR | Status: DC | PRN

## 2022-10-20 MED ORDER — KETOROLAC TROMETHAMINE 30 MG/ML IJ SOLN
INTRAMUSCULAR | Status: AC
  Filled 2022-10-20: qty 1

## 2022-10-20 MED ORDER — SCOPOLAMINE 1 MG/3DAYS TD PT72
1.0000 | MEDICATED_PATCH | TRANSDERMAL | Status: DC
  Administered 2022-10-21: 1.5 mg via TRANSDERMAL
  Filled 2022-10-20: qty 1

## 2022-10-20 MED ORDER — FENTANYL CITRATE (PF) 100 MCG/2ML IJ SOLN
INTRAMUSCULAR | Status: DC | PRN
  Administered 2022-10-20: 50 ug via INTRAVENOUS
  Administered 2022-10-20: 25 ug via INTRAVENOUS
  Administered 2022-10-20: 75 ug via INTRAVENOUS
  Administered 2022-10-20: 50 ug via INTRAVENOUS

## 2022-10-20 NOTE — Sedation Documentation (Signed)
Hand off via phone to [place], [place]

## 2022-10-20 NOTE — H&P (Signed)
Referring Physician(s): [place]  Supervising Physician: Michaelle Birks  Patient Status:  [facility] OP TBA  Chief Complaint: Symptomatic uterine fibroids   Subjective: Patient known to IR service from recent consultation with Dr. Earleen Newport on 10/06/2022 to discuss treatment options for symptomatic uterine fibroids prior to planned hysterectomy to decrease anticipated blood loss. She was deemed an appropriate candidate for UFE and presents today for the procedure prior to planned hysterectomy on 10/21/2022.  Medical history also significant for tobacco use, anxiety/depression, arthritis.  She currently denies fever, headache, chest pain, dyspnea, cough, back pain, dysuria, vomiting.  She does have some abdominal /pelvic discomfort, occasional nausea, menorrhagia.  Past Medical History:  Diagnosis Date   Anxiety    Arthritis    Depression    Drug dependence ([place])    Tooth infection 01/28/2022   Past Surgical History:  Procedure Laterality Date   ENDOMETRIAL BIOPSY  03/09/2022   IR RADIOLOGIST EVAL & MGMT  10/06/2022   LIPOMA EXCISION  2009   on forehead     Allergies: Penicillins and Tramadol  Medications: Prior to Admission medications   Medication Sig Start Date End Date Taking? Authorizing Provider  docusate sodium (COLACE) 100 MG capsule Take 100 mg by mouth daily.    [provider]  fluconazole (DIFLUCAN) 150 MG tablet Take 1 tablet (150 mg total) by mouth daily. 10/09/22   [name], MD  ondansetron (ZOFRAN) 4 MG tablet Take 1 tablet (4 mg total) by mouth every 8 (eight) hours as needed for nausea or vomiting. 07/29/22   Bonnita Hollow, MD  oxyCODONE (OXY IR/ROXICODONE) 5 MG immediate release tablet Take 1 tablet (5 mg total) by mouth every 4 (four) hours as needed for severe pain. For AFTER surgery only., Only take for severe pain, Do not take and drive, do not take with other pain medications 10/22/22   Joylene John D, NP  oxyCODONE (OXY IR/ROXICODONE) 5 MG  immediate release tablet Take 1 tablet (5 mg total) by mouth every 3 (three) hours as needed for severe pain. To use for severe pain as needed starting 2/13 to 2/20 (up until procedure and surgery). Do not take and drive. Do not take with other pain meds. 10/13/22   Cross, Lenna Sciara D, NP  polyethylene glycol (MIRALAX) 17 g packet Take 17 g by mouth daily. 10/09/22   [name], MD  senna-docusate (SENOKOT-S) 8.6-50 MG tablet Take 2 tablets by mouth at bedtime. For AFTER surgery, do not take if having diarrhea 10/09/22   Joylene John D, NP  sertraline (ZOLOFT) 25 MG tablet Take 25 mg by mouth at bedtime. 10/06/22   [provider]  triamcinolone cream (KENALOG) 0.1 % Apply 1 Application topically 2 (two) times daily. 07/29/22   Bonnita Hollow, MD     Vital Signs: Vitals:   10/20/22 1158  BP: (!) 141/75  Pulse: (!) 115  Resp: 16  Temp: 99.2 F (37.3 C)  SpO2: 98%   Code Status: FULL CODE LMP  (LMP Unknown)   Physical Exam awake, alert.  Chest clear to auscultation bilaterally.  Heart with slightly tachycardic but regular rhythm.  Abdomen soft, positive bowel sounds, fibroid uterus, some mild generalized tenderness to palpation.  No lower extremity edema, intact distal pulses.  Imaging: No results found.  Labs:  CBC: Recent Labs    02/11/22 0930 03/09/22 1040 04/09/22 1125 07/14/22 1847 10/08/22 1003  WBC 9.8 6.9 9.7  --  10.4  HGB 13.9 14.1 13.8 14.1 10.4*  HCT 40.9 40.4 38.9 43.4 33.1*  PLT 184.0 227 272  --  496*    COAGS: Recent Labs    01/01/22 1056  INR 0.9  APTT 25.4    BMP: Recent Labs    01/01/22 1056 01/04/22 2235 10/08/22 1003  NA 139 140 135  K 4.0 3.9 3.7  CL 104 107 97*  CO2 27 24 25  $ GLUCOSE 103* 84 76  BUN 16 9 10  $ CALCIUM 8.9 9.4 9.0  CREATININE 1.02 0.92 0.98  GFRNONAA  --  >60 >60    LIVER FUNCTION TESTS: Recent Labs    01/01/22 1056 01/04/22 2235 10/08/22 1003  BILITOT 0.5 0.7 0.7  AST 23 26 22  $ ALT 19 20 20   $ ALKPHOS 54 61 85  PROT 7.2 6.0* 8.5*  ALBUMIN 4.0 2.6* 3.2*    Assessment and Plan: Patient known to IR service from recent consultation with Dr. Earleen Newport on 10/06/2022 to discuss treatment options for symptomatic uterine fibroids prior to planned hysterectomy to decrease anticipated blood loss. She was deemed an appropriate candidate for UFE and presents today for the procedure prior to planned hysterectomy on 10/21/2022.  Medical history also significant for anxiety/depression, arthritis.  MRI abdomen/pelvis performed on 04/24/2022 revealed:  1. Large mass extending from the pelvis into the mid abdomen measuring 24 x 24 x 15 cm, which appears to extend from the uterine fundus and most likely reflects a uterine leiomyoma. However given the size and complexity of this lesion a uterine leiomyosarcoma is a pertinent differential consideration not excluded on this examination. Given the size of the lesion surgical excision suggested. 2. Enlarged uterus with multiple additional uterine masses again most likely reflecting uterine leiomyomas. 3. Ovaries not confidently identified on this examination.   Risks and benefits of procedure were discussed with the patient including, but not limited to bleeding, infection, vascular injury or contrast induced renal failure.  This interventional procedure involves the use of X-rays and because of the nature of the planned procedure, it is possible that we will have prolonged use of X-ray fluoroscopy.  Potential radiation risks to you include (but are not limited to) the following: - A slightly elevated risk for cancer  several years later in life. This risk is typically less than 0.5% percent. This risk is low in comparison to the normal incidence of human cancer, which is 33% for women and 50% for men according to the [place]. - Radiation induced injury can include skin redness, resembling a rash, tissue breakdown / ulcers and hair loss  (which can be temporary or permanent).   The likelihood of either of these occurring depends on the difficulty of the procedure and whether you are sensitive to radiation due to previous procedures, disease, or genetic conditions.   IF your procedure requires a prolonged use of radiation, you will be notified and given written instructions for further action.  It is your responsibility to monitor the irradiated area for the 2 weeks following the procedure and to notify your physician if you are concerned that you have suffered a radiation induced injury.    All of the patient's questions were answered, patient is agreeable to proceed.  Consent signed and in chart.  LABS PENDING  OB/GYN team to admit patient following UFE.  Electronically Signed: D. Rowe Robert, PA-C 10/20/2022, 11:48 AM   I spent a total of 25 minutes at the the patient's bedside AND on the patient's hospital floor or unit, greater than 50% of which  was counseling/coordinating care for bilateral uterine artery embolization

## 2022-10-20 NOTE — Progress Notes (Signed)
Last 5 cc of air removed.

## 2022-10-20 NOTE — Anesthesia Preprocedure Evaluation (Signed)
Anesthesia Evaluation  Patient identified by MRN, date of birth, ID band Patient awake    Reviewed: Allergy & Precautions, NPO status , Patient's Chart, lab work & pertinent test results  Airway Mallampati: I  TM Distance: >3 FB Neck ROM: Full    Dental  (+) Dental Advisory Given, Chipped,    Pulmonary Current Smoker and Patient abstained from smoking.   Pulmonary exam normal breath sounds clear to auscultation       Cardiovascular negative cardio ROS Normal cardiovascular exam Rhythm:Regular Rate:Normal     Neuro/Psych  PSYCHIATRIC DISORDERS Anxiety Depression    negative neurological ROS     GI/Hepatic negative GI ROS, Neg liver ROS,,,  Endo/Other    Morbid obesity  Renal/GU negative Renal ROS  negative genitourinary   Musculoskeletal  (+) Arthritis ,    Abdominal   Peds  Hematology  (+) Blood dyscrasia, anemia   Anesthesia Other Findings S/p uterine artery embolization 10/20/22  Reproductive/Obstetrics                             Anesthesia Physical Anesthesia Plan  ASA: 3  Anesthesia Plan: General   Post-op Pain Management: Tylenol PO (pre-op)*, Ketamine IV* and Lidocaine infusion*   Induction: Intravenous  PONV Risk Score and Plan: 2 and Midazolam, Dexamethasone and Ondansetron  Airway Management Planned: Oral ETT  Additional Equipment:   Intra-op Plan:   Post-operative Plan: Extubation in OR  Informed Consent: I have reviewed the patients History and Physical, chart, labs and discussed the procedure including the risks, benefits and alternatives for the proposed anesthesia with the patient or authorized representative who has indicated his/her understanding and acceptance.     Dental advisory given  Plan Discussed with: CRNA  Anesthesia Plan Comments: (2 IVs)       Anesthesia Quick Evaluation

## 2022-10-20 NOTE — Sedation Documentation (Signed)
Left uterine artery embolization complete - moving forward to right side.

## 2022-10-20 NOTE — Procedures (Signed)
Vascular and Interventional Radiology Procedure Note  Patient: Megan [name] DOB: [birth year] Medical Record Number: [id number] Note Date/Time: 10/20/22 5:40 PM   Performing Physician: Michaelle Birks, MD Assistant(s): None  Diagnosis: Dysfunctional uterine bleeding. Pre operative embolization.   Procedure(s):  PELVIC ARTERIOGRAPHY BILATERAL UTERINE ARTERY EMBOLIZATION for FIBROIDS   Anesthesia: Conscious Sedation Complications: None Estimated Blood Loss: Minimal Specimens: None  Findings:  - access via the LEFT radial artery. - Bilateral enlarged and tortuous uterine arteries. - Successful embolization with Gelfoam to near-stasis. - TR band closure at L wrist at the end of the case  Plan: - Post sheath removal precautions.  - Standard post radial access deflation protocol.   Final report to follow once all images are reviewed and compared with previous studies.  See detailed dictation with images in PACS. The patient tolerated the procedure well without incident or complication and was returned to Recovery in stable condition.    Michaelle Birks, MD [facility]   Pager. [place]

## 2022-10-20 NOTE — H&P (Addendum)
Gynecologic Oncology History and Physical  10/20/2022  Patient currently undergoing uterine artery embolization with [facility] on 10/20/22 with plans for admission for pain control and monitoring with surgery on 10/21/2022 with Dr. Jeral Pinch.  Treatment History: Patient presented in July 2023 after noticing abnormal uterine bleeding and abdominal fullness in the past year that has worsened with time.  This was initially noticing that she felt something when she was laying on her belly.  With time, she noticed increasing abdominal girth and that her clothes were fitting differently.  She endorses a long history of heavy bleeding with frequent passage of clots during her menses; menses would typically be painful and last approximately 1 week.  Her menses were regular though, without any intermenstrual bleeding or spotting.  She received a Depo-Provera shot on 01/19/2022 to help with bleeding and was prescribed oral progesterone in May after she developed abnormal uterine bleeding.  She describes this is daily bleeding with intermittent pain that comes and goes.  If she is stressed or more active, she will have heavier bleeding.  She wears 2 thick pads at a time and change them about every hour and a half, noting that they are saturated.  When she urinates, she will pass up to plum sized clots.  Patient was seen recently in the setting of her enlarged fibroid uterus and abnormal bleeding, was given prescription for Lysteda.  This was too expensive and patient has not been able to have it filled.   01/04/2022: CT of the abdomen and pelvis shows enlarged myomatous uterus with multiple fibroids.  Uterus measures approximately 23 cm in craniocaudal length with a large fundal fibroid extending into the abdomen and displacing bowel loops and causing some compression of the IVC.  The largest exophytic fundal fibroid measures approximately 21 x 15 x 20 cm.  No adenopathy, no free fluid, no evidence of carcinomatosis.    On 03/09/2022, she underwent endometrial biopsy, which showed minute strips of inactive endometrium mixed with blood and normal endocervical tissue.  Negative for hyperplasia or malignancy.   04/09/22: Pap NIML, HR HPV negative   04/24/2022: MRI abdomen/pelvis: Large mass extending from the pelvis into the mid abdomen measuring 24 x 24 x 15 cm, appears to extend from the uterine fundus and most likely reflects a uterine fibroid.  Given the size and complexity of this lesion, a uterine leiomyosarcoma is on the differential.  Enlarged uterus with multiple additional uterine masses, likely reflecting uterine fibroids.  Ovaries not confidently identified on this exam.   Patient seen by [facility] given her desire to preserve uterus if at all possible.  Plan had been for open myomectomy but after review of MRI, recommendation was made to proceed with definitive surgery.  Patient met with interventional radiology on 2/6 to discuss preoperative embolization. Patient currently undergoing uterine artery embolization with [facility] on 10/20/22 with plans for admission and surgery on 10/21/2022.  Interval History: Patient reports that she is overall doing well although has continued to have more pain related to her large fibroid.  Bleeding has decreased and overall is darker in appearance and looks more like old blood rather than bright red blood.  She is continuing to use oxycodone regularly for pain.  Thinks that she has developed a yeast infection.  She continues to struggle with constipation.  Uses Ex-Lax and laxative intermittently.  Reports having 1 bowel movement a week.  Continues to struggle with appetite and bloating/early satiety.  Endorses urinary frequency.  Due to pain,  had to stop working.  Has lost her car.  Past Medical/Surgical History: Past Medical History:  Diagnosis Date   Anxiety    Arthritis    Depression    Drug dependence ([place])    Tooth infection 01/28/2022    Past Surgical History:   Procedure Laterality Date   ENDOMETRIAL BIOPSY  03/09/2022   IR RADIOLOGIST EVAL & MGMT  10/06/2022   LIPOMA EXCISION  2009   on forehead    Family History  Problem Relation Age of Onset   Bone cancer Maternal Aunt    Colon cancer Neg Hx    Breast cancer Neg Hx    Ovarian cancer Neg Hx    Endometrial cancer Neg Hx    Pancreatic cancer Neg Hx    Prostate cancer Neg Hx     Social History   Socioeconomic History   Marital status: Single    Spouse name: Not on file   Number of children: Not on file   Years of education: Not on file   Highest education level: Not on file  Occupational History   Occupation: on leave from work  Tobacco Use   Smoking status: Every Day    Packs/day: 0.50    Years: 18.00    Total pack years: 9.00    Types: Cigarettes   Smokeless tobacco: Never  Vaping Use   Vaping Use: Never used  Substance and Sexual Activity   Alcohol use: Not Currently    Alcohol/week: 4.0 standard drinks of alcohol    Types: 4 Cans of beer per week    Comment: weekly   Drug use: Yes    Types: Other-see comments    Comment: CBD eatables   Sexual activity: Not Currently  Other Topics Concern   Not on file  Social History Narrative   Not on file   Social Determinants of Health   Financial Resource Strain: High Risk (10/13/2022)   Overall Financial Resource Strain (CARDIA)    Difficulty of Paying Living Expenses: Very hard  Food Insecurity: Food Insecurity Present (10/13/2022)   Hunger Vital Sign    Worried About Running Out of Food in the Last Year: Sometimes true    Ran Out of Food in the Last Year: Not on file  Transportation Needs: Not on file  Physical Activity: Not on file  Stress: Not on file  Social Connections: Not on file    Current Medications:  Current Facility-Administered Medications:    0.9 %  sodium chloride infusion, , Intravenous, Continuous, Han, Aimee H, PA-C, Last Rate: 20 mL/hr at 10/20/22 1235, New Bag at 10/20/22 1235   [START ON  10/21/2022] acetaminophen (TYLENOL) tablet 1,000 mg, 1,000 mg, Oral, On Call to OR, Jermiyah Ricotta D, NP   [START ON 10/21/2022] dexamethasone (DECADRON) injection 4 mg, 4 mg, Intravenous, On Call to OR, Cynthis Purington D, NP   diphenhydrAMINE (BENADRYL) injection 12.5 mg, 12.5 mg, Intravenous, Q6H PRN **OR** diphenhydrAMINE (BENADRYL) 12.5 MG/5ML elixir 12.5 mg, 12.5 mg, Oral, Q6H PRN, Mugweru, Jon, MD   fentaNYL (SUBLIMAZE) 100 MCG/2ML injection, , , ,    [START ON 10/21/2022] gabapentin (NEURONTIN) capsule 300 mg, 300 mg, Oral, On Call to OR, Danny Zimny D, NP   heparin injection 5,000 Units, 5,000 Units, Subcutaneous, 120 min pre-op, Frandy Basnett D, NP   heparin sodium (porcine) 1000 UNIT/ML injection, , , ,    HYDROmorphone (DILAUDID) 1 mg/mL PCA injection, , Intravenous, Q4H, [name], MD   HYDROmorphone (DILAUDID) 2 MG/ML injection, , , ,  HYDROmorphone (DILAUDID) 2 MG/ML injection, , , ,    ketorolac (TORADOL) 30 MG/ML injection, , , ,    midazolam (VERSED) 2 MG/2ML injection, , , ,    naloxone (NARCAN) injection 0.4 mg, 0.4 mg, Intravenous, PRN **AND** sodium chloride flush (NS) 0.9 % injection 9 mL, 9 mL, Intravenous, PRN, Mugweru, Jon, MD   nitroGLYCERIN 0.1 mg/mL in dextrose 5 % infusion, , , ,    ondansetron (ZOFRAN) injection 4 mg, 4 mg, Intravenous, Q6H PRN, Mugweru, Jon, MD   povidone-iodine 10 % swab 2 Application, 2 Application, Topical, Once, Mashelle Busick D, NP   [START ON 10/21/2022] scopolamine (TRANSDERM-SCOP) 1 MG/3DAYS 1.5 mg, 1 patch, Transdermal, On Call to OR, Chikita Dogan D, NP   verapamil (ISOPTIN) 2.5 MG/ML injection, , , ,   Review of Systems: + Decreased appetite, fatigue, bloating, abdominal pain, constipation, frequency, hot flashes, pelvic pain, back pain, muscle pain/cramp, itch, anxiety, depression. Denies fevers, chills, unexplained weight changes. Denies hearing loss, neck lumps or masses, mouth sores, ringing in ears or voice  changes. Denies cough or wheezing.  Denies shortness of breath. Denies chest pain or palpitations. Denies leg swelling. Denies blood in stools, diarrhea, nausea, vomiting, or early satiety. Denies pain with intercourse, dysuria, hematuria or incontinence. Denies joint pain. Denies rash, or wounds. Denies dizziness, headaches, numbness or seizures. Denies swollen lymph nodes or glands, denies easy bruising or bleeding. Denies confusion or decreased concentration.  Physical Exam (from Dr. Berline Lopes office visit): BP (!) 133/93 (BP Location: Right Arm)   Pulse 76   Temp 99.2 F (37.3 C) (Oral)   Resp 14   Ht 5' 8"$  (1.727 m)   Wt 257 lb 12.8 oz (116.9 kg)   LMP  (LMP Unknown)   SpO2 100%   BMI 39.20 kg/m  General: Alert, oriented, no acute distress. HEENT: Normocephalic, atraumatic, sclera anicteric. Chest: Clear to auscultation bilaterally.  No wheezes or rhonchi. Cardiovascular: Regular rate and rhythm, no murmurs. Abdomen: Obese. Normoactive bowel sounds. Enlarged mass within the mid and upper abdomen, palpated approximately 8 cm above the umbilicus, firm, mass mostly in the midline and to the right.  Minimal mobility due to its size. No palpable fluid wave.  Extremities: Grossly normal range of motion.  Warm, well perfused.  No edema bilaterally.  Laboratory & Radiologic Studies:    Latest Ref Rng & Units 10/20/2022   11:46 AM 10/08/2022   10:03 AM 07/14/2022    6:47 PM  CBC  WBC 4.0 - 10.5 K/uL 10.6  10.4    Hemoglobin 12.0 - 15.0 g/dL 10.9  10.4  14.1   Hematocrit 36.0 - 46.0 % 35.7  33.1  43.4   Platelets 150 - 400 K/uL 556  496        Latest Ref Rng & Units 10/20/2022   11:46 AM 10/08/2022   10:03 AM 01/04/2022   10:35 PM  BMP  Glucose 70 - 99 mg/dL 103  76  84   BUN 6 - 20 mg/dL 11  10  9   $ Creatinine 0.44 - 1.00 mg/dL 1.09  0.98  0.92   Sodium 135 - 145 mmol/L 139  135  140   Potassium 3.5 - 5.1 mmol/L 4.0  3.7  3.9   Chloride 98 - 111 mmol/L 100  97  107   CO2 22 -  32 mmol/L 24  25  24   $ Calcium 8.9 - 10.3 mg/dL 9.3  9.0  9.4    Assessment & Plan:  Megan [name] is a 37 y.o. woman with enlarged fibroid uterus. She is undergoing [place] today with IR with plans for the below surgery on 10/21/22. Pain control will be a priority overnight.   From Dr. Berline Lopes on 10/09/22: We reviewed discussed plan to move forward with definitive surgery.  Patient has spent much time considering her options and is understanding of the fact that she will likely lose her uterus.  She has excepted this and would like to move forward with surgery.  Plan will be for total hysterectomy with bilateral salpingectomy.  If the fibroid appears more pedunculated and has a stalk that I think can be safely transected, we discussed the possibility of myomectomy with preservation of the uterus.  Uterus versus fibroid will be sent for frozen section.  If benign, we will plan to take bilateral fallopian tubes and leave both ovaries in situ.  If malignant, depending on suspected histology, we will make a decision IntraOp whether safe to leave bilateral ovaries. We discussed the unlikely scenario that this mass represents a large solid ovarian mass.  If this were the case, I could proceed with unilateral oophorectomy and bilateral salpingectomy.  The mass would still be sent for frozen section and decisions about the uterus and contralateral ovary would be made based on frozen section.  We discussed that in the setting of a malignancy, whether uterine or ovarian, additional procedures may need to be performed including lymph node sampling, peritoneal biopsies, and omentectomy.  The patient was consented for a diagnostic laparoscopy, open versus robotic assisted hysterectomy, possible myomectomy, bilateral salpingectomy, possible unilateral versus bilateral oophorectomy, possible staging. The risks of surgery were discussed in detail and she understands these to include infection; wound separation; hernia;  vaginal cuff separation, injury to adjacent organs such as bowel, bladder, blood vessels, ureters and nerves; bleeding which may require blood transfusion; anesthesia risk; thromboembolic events; possible death; unforeseen complications; possible need for re-exploration; medical complications such as heart attack, stroke, pleural effusion and pneumonia; and, if full lymphadenectomy is performed the risk of lymphedema and lymphocyst. The patient will receive DVT and antibiotic prophylaxis as indicated. She voiced a clear understanding. She had the opportunity to ask questions. Perioperative instructions were reviewed with her. Prescriptions for post-op medications were sent to her pharmacy of choice.  Despite heavy bleeding, the patient has not been anemic until recent blood work.  Hemoglobin is just over 10.  Plan for preoperative embolization to decrease blood loss associated with surgery.  Given anticipated need for larger incision, even if the surgery can be done in a minimally invasive manner, discussed at least overnight hospital stay, possibly 2-3 nights.  The patient needs a refill on her narcotic medication.  We will send this to cover her from now until surgery.  We had a frank discussion today about the fact that she likely has dependence to opioids given her long-term use.  We will have to work to wean her off of narcotics after surgery.  This may benefit from having her be seen in a pain management clinic.  May require use of long acting agent such as methadone. Patient is understanding of this discussion and is very motivated to get off the narcotics after surgery.   Given vulvar pruritus and symptoms consistent with a yeast infection, dose of Diflucan sent to the patient's pharmacy today.  Referral placed to social work today given the emotional and physical toll that patient's current condition has taken.  She has not been able to work  because of her pain.  Joylene John NP Gynecologic  Oncology

## 2022-10-21 ENCOUNTER — Inpatient Hospital Stay (HOSPITAL_COMMUNITY): Payer: BLUE CROSS/BLUE SHIELD | Admitting: Anesthesiology

## 2022-10-21 ENCOUNTER — Other Ambulatory Visit: Payer: Self-pay

## 2022-10-21 ENCOUNTER — Encounter (HOSPITAL_COMMUNITY)
Admission: AD | Disposition: A | Discharge status: Home / Self Care | Payer: Self-pay | Source: Ambulatory Visit | Attending: Interventional Radiology

## 2022-10-21 ENCOUNTER — Inpatient Hospital Stay (HOSPITAL_COMMUNITY): Admission: RE | Admit: 2022-10-21 | Payer: Medicaid Other | Source: Ambulatory Visit | Admitting: Gynecologic Oncology

## 2022-10-21 DIAGNOSIS — N921 Excessive and frequent menstruation with irregular cycle: Secondary | ICD-10-CM

## 2022-10-21 DIAGNOSIS — D219 Benign neoplasm of connective and other soft tissue, unspecified: Secondary | ICD-10-CM

## 2022-10-21 DIAGNOSIS — F418 Other specified anxiety disorders: Secondary | ICD-10-CM

## 2022-10-21 DIAGNOSIS — D259 Leiomyoma of uterus, unspecified: Secondary | ICD-10-CM

## 2022-10-21 DIAGNOSIS — F17210 Nicotine dependence, cigarettes, uncomplicated: Secondary | ICD-10-CM

## 2022-10-21 DIAGNOSIS — N852 Hypertrophy of uterus: Secondary | ICD-10-CM

## 2022-10-21 HISTORY — PX: ROBOTIC ASSISTED TOTAL HYSTERECTOMY WITH BILATERAL SALPINGO OOPHERECTOMY: SHX6086

## 2022-10-21 LAB — CBC
HCT: 32 % — ABNORMAL LOW (ref 36.0–46.0)
Hemoglobin: 9.6 g/dL — ABNORMAL LOW (ref 12.0–15.0)
MCH: 25.4 pg — ABNORMAL LOW (ref 26.0–34.0)
MCHC: 30 g/dL (ref 30.0–36.0)
MCV: 84.7 fL (ref 80.0–100.0)
Platelets: 485 10*3/uL — ABNORMAL HIGH (ref 150–400)
RBC: 3.78 MIL/uL — ABNORMAL LOW (ref 3.87–5.11)
RDW: 15 % (ref 11.5–15.5)
WBC: 16.3 10*3/uL — ABNORMAL HIGH (ref 4.0–10.5)
nRBC: 0 % (ref 0.0–0.2)

## 2022-10-21 LAB — BASIC METABOLIC PANEL
Anion gap: 11 (ref 5–15)
BUN: 11 mg/dL (ref 6–20)
CO2: 22 mmol/L (ref 22–32)
Calcium: 8.7 mg/dL — ABNORMAL LOW (ref 8.9–10.3)
Chloride: 101 mmol/L (ref 98–111)
Creatinine, Ser: 0.95 mg/dL (ref 0.44–1.00)
GFR, Estimated: >60 mL/min (ref >60)
Glucose, Bld: 138 mg/dL — ABNORMAL HIGH (ref 70–99)
Potassium: 4.5 mmol/L (ref 3.5–5.1)
Sodium: 134 mmol/L — ABNORMAL LOW (ref 135–145)

## 2022-10-21 LAB — SURGICAL PCR SCREEN
MRSA, PCR: NEGATIVE
Staphylococcus aureus: NEGATIVE

## 2022-10-21 LAB — ABO/RH: ABO/RH(D): O POS

## 2022-10-21 SURGERY — HYSTERECTOMY, TOTAL, ROBOT-ASSISTED, LAPAROSCOPIC, WITH BILATERAL SALPINGO-OOPHORECTOMY
Anesthesia: General | Laterality: Bilateral

## 2022-10-21 MED ORDER — SODIUM CHLORIDE (PF) 0.9 % IJ SOLN
INTRAMUSCULAR | Status: DC | PRN
  Administered 2022-10-21: 20 mL

## 2022-10-21 MED ORDER — HYDROMORPHONE 1 MG/ML IV SOLN
INTRAVENOUS | Status: DC
  Administered 2022-10-21: 5.1 mg via INTRAVENOUS
  Administered 2022-10-21: 30 mg via INTRAVENOUS
  Administered 2022-10-21: 2.7 mg via INTRAVENOUS
  Administered 2022-10-22: 5.1 mg via INTRAVENOUS
  Administered 2022-10-22: 1.5 mg via INTRAVENOUS
  Filled 2022-10-21: qty 30

## 2022-10-21 MED ORDER — BUPIVACAINE HCL 0.25 % IJ SOLN
INTRAMUSCULAR | Status: DC | PRN
  Administered 2022-10-21: 50 mL

## 2022-10-21 MED ORDER — PROPOFOL 10 MG/ML IV BOLUS
INTRAVENOUS | Status: DC | PRN
  Administered 2022-10-21: 150 mg via INTRAVENOUS

## 2022-10-21 MED ORDER — ONDANSETRON HCL 4 MG/2ML IJ SOLN: 4.0000 mg | Freq: Four times a day (QID) | INTRAMUSCULAR | Status: DC | PRN

## 2022-10-21 MED ORDER — CEFAZOLIN SODIUM 1 G IJ SOLR
INTRAMUSCULAR | Status: AC
  Filled 2022-10-21: qty 10

## 2022-10-21 MED ORDER — CHLORHEXIDINE GLUCONATE CLOTH 2 % EX PADS
6.0000 | MEDICATED_PAD | Freq: Every day | CUTANEOUS | Status: DC
  Administered 2022-10-21: 6 via TOPICAL

## 2022-10-21 MED ORDER — PHENYLEPHRINE 80 MCG/ML (10ML) SYRINGE FOR IV PUSH (FOR BLOOD PRESSURE SUPPORT)
PREFILLED_SYRINGE | INTRAVENOUS | Status: AC
  Filled 2022-10-21: qty 10

## 2022-10-21 MED ORDER — SODIUM CHLORIDE (PF) 0.9 % IJ SOLN
INTRAMUSCULAR | Status: AC
  Filled 2022-10-21: qty 20

## 2022-10-21 MED ORDER — ORAL CARE MOUTH RINSE: 15.0000 mL | Freq: Once | OROMUCOSAL | Status: AC

## 2022-10-21 MED ORDER — LIDOCAINE HCL 2 % IJ SOLN
INTRAMUSCULAR | Status: AC
  Filled 2022-10-21: qty 40

## 2022-10-21 MED ORDER — FENTANYL CITRATE (PF) 250 MCG/5ML IJ SOLN
INTRAMUSCULAR | Status: AC
  Filled 2022-10-21: qty 5

## 2022-10-21 MED ORDER — FENTANYL CITRATE (PF) 100 MCG/2ML IJ SOLN
INTRAMUSCULAR | Status: AC
  Filled 2022-10-21: qty 2

## 2022-10-21 MED ORDER — DIPHENHYDRAMINE HCL 12.5 MG/5ML PO ELIX: 12.5000 mg | ORAL_SOLUTION | Freq: Four times a day (QID) | ORAL | Status: DC | PRN

## 2022-10-21 MED ORDER — LIDOCAINE HCL (PF) 2 % IJ SOLN
INTRAMUSCULAR | Status: DC | PRN
  Administered 2022-10-21: 1.5 mg/kg/h via INTRADERMAL

## 2022-10-21 MED ORDER — HYDROMORPHONE HCL 1 MG/ML IJ SOLN
INTRAMUSCULAR | Status: AC
  Administered 2022-10-21: 0.5 mg via INTRAVENOUS
  Filled 2022-10-21: qty 2

## 2022-10-21 MED ORDER — ACETAMINOPHEN 10 MG/ML IV SOLN
1000.0000 mg | Freq: Once | INTRAVENOUS | Status: AC
  Administered 2022-10-21: 1000 mg via INTRAVENOUS

## 2022-10-21 MED ORDER — POLYETHYLENE GLYCOL 3350 17 G PO PACK
17.0000 g | PACK | Freq: Every day | ORAL | Status: DC
  Administered 2022-10-21 – 2022-10-23 (×3): 17 g via ORAL
  Filled 2022-10-21 (×3): qty 1

## 2022-10-21 MED ORDER — KCL IN DEXTROSE-NACL 10-5-0.45 MEQ/L-%-% IV SOLN
INTRAVENOUS | Status: DC
  Filled 2022-10-21 (×2): qty 1000

## 2022-10-21 MED ORDER — LIDOCAINE HCL (PF) 2 % IJ SOLN
INTRAMUSCULAR | Status: AC
  Filled 2022-10-21: qty 5

## 2022-10-21 MED ORDER — LACTATED RINGERS IV SOLN: INTRAVENOUS | Status: DC

## 2022-10-21 MED ORDER — HYDROMORPHONE HCL 1 MG/ML IJ SOLN
0.5000 mg | INTRAMUSCULAR | Status: AC | PRN
  Administered 2022-10-21 (×3): 0.5 mg via INTRAVENOUS

## 2022-10-21 MED ORDER — ROCURONIUM BROMIDE 10 MG/ML (PF) SYRINGE
PREFILLED_SYRINGE | INTRAVENOUS | Status: AC
  Filled 2022-10-21: qty 10

## 2022-10-21 MED ORDER — PHENYLEPHRINE 80 MCG/ML (10ML) SYRINGE FOR IV PUSH (FOR BLOOD PRESSURE SUPPORT)
PREFILLED_SYRINGE | INTRAVENOUS | Status: DC | PRN
  Administered 2022-10-21: 160 ug via INTRAVENOUS
  Administered 2022-10-21: 240 ug via INTRAVENOUS
  Administered 2022-10-21: 160 ug via INTRAVENOUS

## 2022-10-21 MED ORDER — KETOROLAC TROMETHAMINE 15 MG/ML IJ SOLN: 15.0000 mg | INTRAMUSCULAR | Status: DC

## 2022-10-21 MED ORDER — ONDANSETRON HCL 4 MG/2ML IJ SOLN
INTRAMUSCULAR | Status: DC | PRN
  Administered 2022-10-21: 4 mg via INTRAVENOUS

## 2022-10-21 MED ORDER — LACTATED RINGERS IR SOLN
Status: DC | PRN
  Administered 2022-10-21: 1000 mL

## 2022-10-21 MED ORDER — BUPIVACAINE HCL 0.25 % IJ SOLN
INTRAMUSCULAR | Status: AC
  Filled 2022-10-21: qty 1

## 2022-10-21 MED ORDER — FENTANYL CITRATE PF 50 MCG/ML IJ SOSY
25.0000 ug | PREFILLED_SYRINGE | INTRAMUSCULAR | Status: DC | PRN
  Administered 2022-10-21 (×2): 50 ug via INTRAVENOUS

## 2022-10-21 MED ORDER — SODIUM CHLORIDE 0.9% FLUSH: 9.0000 mL | INTRAVENOUS | Status: DC | PRN

## 2022-10-21 MED ORDER — KETAMINE HCL 50 MG/5ML IJ SOSY
PREFILLED_SYRINGE | INTRAMUSCULAR | Status: AC
  Filled 2022-10-21: qty 5

## 2022-10-21 MED ORDER — FENTANYL CITRATE (PF) 100 MCG/2ML IJ SOLN
INTRAMUSCULAR | Status: DC | PRN
  Administered 2022-10-21 (×2): 50 ug via INTRAVENOUS
  Administered 2022-10-21: 100 ug via INTRAVENOUS
  Administered 2022-10-21 (×3): 50 ug via INTRAVENOUS

## 2022-10-21 MED ORDER — DEXAMETHASONE SODIUM PHOSPHATE 10 MG/ML IJ SOLN
INTRAMUSCULAR | Status: AC
  Filled 2022-10-21: qty 1

## 2022-10-21 MED ORDER — KETAMINE HCL 10 MG/ML IJ SOLN
INTRAMUSCULAR | Status: DC | PRN
  Administered 2022-10-21: 20 mg via INTRAVENOUS
  Administered 2022-10-21: 30 mg via INTRAVENOUS

## 2022-10-21 MED ORDER — PROPOFOL 10 MG/ML IV BOLUS
INTRAVENOUS | Status: AC
  Filled 2022-10-21: qty 20

## 2022-10-21 MED ORDER — NALOXONE HCL 0.4 MG/ML IJ SOLN: 0.4000 mg | INTRAMUSCULAR | Status: DC | PRN

## 2022-10-21 MED ORDER — 0.9 % SODIUM CHLORIDE (POUR BTL) OPTIME
TOPICAL | Status: DC | PRN
  Administered 2022-10-21: 4000 mL

## 2022-10-21 MED ORDER — SENNOSIDES-DOCUSATE SODIUM 8.6-50 MG PO TABS
2.0000 | ORAL_TABLET | Freq: Every day | ORAL | Status: DC
  Administered 2022-10-21 – 2022-10-22 (×2): 2 via ORAL
  Filled 2022-10-21 (×2): qty 2

## 2022-10-21 MED ORDER — SUGAMMADEX SODIUM 200 MG/2ML IV SOLN
INTRAVENOUS | Status: DC | PRN
  Administered 2022-10-21: 200 mg via INTRAVENOUS

## 2022-10-21 MED ORDER — GABAPENTIN 100 MG PO CAPS
100.0000 mg | ORAL_CAPSULE | Freq: Three times a day (TID) | ORAL | Status: DC
  Administered 2022-10-21 – 2022-10-23 (×7): 100 mg via ORAL
  Filled 2022-10-21 (×7): qty 1

## 2022-10-21 MED ORDER — DIPHENHYDRAMINE HCL 50 MG/ML IJ SOLN: 12.5000 mg | Freq: Four times a day (QID) | INTRAMUSCULAR | Status: DC | PRN

## 2022-10-21 MED ORDER — CHLORHEXIDINE GLUCONATE 0.12 % MT SOLN
15.0000 mL | Freq: Once | OROMUCOSAL | Status: AC
  Administered 2022-10-21: 15 mL via OROMUCOSAL

## 2022-10-21 MED ORDER — ENOXAPARIN SODIUM 40 MG/0.4ML IJ SOSY
40.0000 mg | PREFILLED_SYRINGE | INTRAMUSCULAR | Status: DC
  Administered 2022-10-22 – 2022-10-23 (×2): 40 mg via SUBCUTANEOUS
  Filled 2022-10-21 (×2): qty 0.4

## 2022-10-21 MED ORDER — ALUM & MAG HYDROXIDE-SIMETH 200-200-20 MG/5ML PO SUSP: 30.0000 mL | ORAL | Status: DC | PRN

## 2022-10-21 MED ORDER — ACETAMINOPHEN 500 MG PO TABS
1000.0000 mg | ORAL_TABLET | Freq: Four times a day (QID) | ORAL | Status: DC
  Administered 2022-10-22 – 2022-10-23 (×4): 1000 mg via ORAL
  Filled 2022-10-21 (×6): qty 2

## 2022-10-21 MED ORDER — ROCURONIUM BROMIDE 10 MG/ML (PF) SYRINGE
PREFILLED_SYRINGE | INTRAVENOUS | Status: DC | PRN
  Administered 2022-10-21 (×2): 20 mg via INTRAVENOUS
  Administered 2022-10-21: 100 mg via INTRAVENOUS

## 2022-10-21 MED ORDER — ONDANSETRON HCL 4 MG/2ML IJ SOLN
INTRAMUSCULAR | Status: AC
  Filled 2022-10-21: qty 2

## 2022-10-21 MED ORDER — BUPIVACAINE LIPOSOME 1.3 % IJ SUSP
INTRAMUSCULAR | Status: DC | PRN
  Administered 2022-10-21: 20 mL

## 2022-10-21 MED ORDER — MIDAZOLAM HCL 2 MG/2ML IJ SOLN
INTRAMUSCULAR | Status: AC
  Filled 2022-10-21: qty 2

## 2022-10-21 MED ORDER — SIMETHICONE 80 MG PO CHEW: 80.0000 mg | CHEWABLE_TABLET | Freq: Four times a day (QID) | ORAL | Status: DC | PRN

## 2022-10-21 MED ORDER — ACETAMINOPHEN 10 MG/ML IV SOLN
INTRAVENOUS | Status: AC
  Filled 2022-10-21: qty 100

## 2022-10-21 MED ORDER — ONDANSETRON HCL 4 MG PO TABS: 4.0000 mg | ORAL_TABLET | Freq: Four times a day (QID) | ORAL | Status: DC | PRN

## 2022-10-21 MED ORDER — FENTANYL CITRATE PF 50 MCG/ML IJ SOSY
PREFILLED_SYRINGE | INTRAMUSCULAR | Status: AC
  Administered 2022-10-21: 50 ug via INTRAVENOUS
  Filled 2022-10-21: qty 3

## 2022-10-21 MED ORDER — MIDAZOLAM HCL 5 MG/5ML IJ SOLN
INTRAMUSCULAR | Status: DC | PRN
  Administered 2022-10-21: 2 mg via INTRAVENOUS

## 2022-10-21 MED ORDER — LIDOCAINE 2% (20 MG/ML) 5 ML SYRINGE
INTRAMUSCULAR | Status: DC | PRN
  Administered 2022-10-21: 100 mg via INTRAVENOUS

## 2022-10-21 MED ORDER — STERILE WATER FOR IRRIGATION IR SOLN
Status: DC | PRN
  Administered 2022-10-21: 1000 mL

## 2022-10-21 MED ORDER — BUPIVACAINE LIPOSOME 1.3 % IJ SUSP
INTRAMUSCULAR | Status: AC
  Filled 2022-10-21: qty 20

## 2022-10-21 SURGICAL SUPPLY — 80 items
APPLICATOR SURGIFLO ENDO (HEMOSTASIS) IMPLANT
BAG COUNTER SPONGE SURGICOUNT (BAG) IMPLANT
BAG LAPAROSCOPIC 12 15 PORT 16 (BASKET) IMPLANT
BAG RETRIEVAL 12/15 (BASKET)
BLADE SURG SZ10 CARB STEEL (BLADE) ×1 IMPLANT
COVER BACK TABLE 60X90IN (DRAPES) ×2 IMPLANT
COVER TIP SHEARS 8 DVNC (MISCELLANEOUS) ×2 IMPLANT
COVER TIP SHEARS 8MM DA VINCI (MISCELLANEOUS) ×2
DERMABOND ADVANCED .7 DNX12 (GAUZE/BANDAGES/DRESSINGS) ×3 IMPLANT
DRAPE ARM DVNC X/XI (DISPOSABLE) ×8 IMPLANT
DRAPE COLUMN DVNC XI (DISPOSABLE) ×2 IMPLANT
DRAPE DA VINCI XI ARM (DISPOSABLE) ×8
DRAPE DA VINCI XI COLUMN (DISPOSABLE) ×2
DRAPE SHEET LG 3/4 BI-LAMINATE (DRAPES) ×2 IMPLANT
DRAPE SURG IRRIG POUCH 19X23 (DRAPES) ×2 IMPLANT
DRSG OPSITE POSTOP 4X10 (GAUZE/BANDAGES/DRESSINGS) ×1 IMPLANT
DRSG OPSITE POSTOP 4X6 (GAUZE/BANDAGES/DRESSINGS) IMPLANT
DRSG OPSITE POSTOP 4X8 (GAUZE/BANDAGES/DRESSINGS) IMPLANT
ELECT PENCIL ROCKER SW 15FT (MISCELLANEOUS) ×1 IMPLANT
ELECT REM PT RETURN 15FT ADLT (MISCELLANEOUS) ×2 IMPLANT
GAUZE 4X4 16PLY ~~LOC~~+RFID DBL (SPONGE) ×4 IMPLANT
GLOVE BIO SURGEON STRL SZ 6 (GLOVE) ×8 IMPLANT
GLOVE BIO SURGEON STRL SZ 6.5 (GLOVE) ×2 IMPLANT
GOWN STRL REUS W/ TWL LRG LVL3 (GOWN DISPOSABLE) ×8 IMPLANT
GOWN STRL REUS W/TWL LRG LVL3 (GOWN DISPOSABLE) ×8
GRASPER SUT TROCAR 14GX15 (MISCELLANEOUS) IMPLANT
HANDLE SUCTION POOLE (INSTRUMENTS) ×1 IMPLANT
HIBICLENS CHG 4% 4OZ BTL (MISCELLANEOUS) ×4 IMPLANT
HOLDER FOLEY CATH W/STRAP (MISCELLANEOUS) IMPLANT
IRRIG SUCT STRYKERFLOW 2 WTIP (MISCELLANEOUS) ×2
IRRIGATION SUCT STRKRFLW 2 WTP (MISCELLANEOUS) ×2 IMPLANT
KIT PROCEDURE DA VINCI SI (MISCELLANEOUS)
KIT PROCEDURE DVNC SI (MISCELLANEOUS) IMPLANT
KIT TURNOVER KIT A (KITS) IMPLANT
LIGASURE IMPACT 36 18CM CVD LR (INSTRUMENTS) ×1 IMPLANT
MANIPULATOR ADVINCU DEL 3.0 PL (MISCELLANEOUS) IMPLANT
MANIPULATOR ADVINCU DEL 3.5 PL (MISCELLANEOUS) ×1 IMPLANT
MANIPULATOR UTERINE 4.5 ZUMI (MISCELLANEOUS) IMPLANT
NDL HYPO 21X1.5 SAFETY (NEEDLE) ×1 IMPLANT
NDL SPNL 18GX3.5 QUINCKE PK (NEEDLE) IMPLANT
NEEDLE HYPO 21X1.5 SAFETY (NEEDLE) ×4 IMPLANT
NEEDLE SPNL 18GX3.5 QUINCKE PK (NEEDLE) IMPLANT
OBTURATOR OPTICAL STANDARD 8MM (TROCAR) ×2
OBTURATOR OPTICAL STND 8 DVNC (TROCAR) ×2
OBTURATOR OPTICALSTD 8 DVNC (TROCAR) ×2 IMPLANT
PACK ROBOT GYN CUSTOM WL (TRAY / TRAY PROCEDURE) ×2 IMPLANT
PAD POSITIONING PINK XL (MISCELLANEOUS) ×2 IMPLANT
PORT ACCESS TROCAR AIRSEAL 12 (TROCAR) IMPLANT
SEAL CANN UNIV 5-8 DVNC XI (MISCELLANEOUS) ×7 IMPLANT
SEAL XI 5MM-8MM UNIVERSAL (MISCELLANEOUS) ×8
SET TRI-LUMEN FLTR TB AIRSEAL (TUBING) ×2 IMPLANT
SPIKE FLUID TRANSFER (MISCELLANEOUS) ×2 IMPLANT
SPONGE T-LAP 18X18 ~~LOC~~+RFID (SPONGE) ×3 IMPLANT
SUCTION POOLE HANDLE (INSTRUMENTS) ×2
SURGIFLO W/THROMBIN 8M KIT (HEMOSTASIS) ×1 IMPLANT
SUT MNCRL AB 4-0 PS2 18 (SUTURE) IMPLANT
SUT PDS AB 1 TP1 96 (SUTURE) ×2 IMPLANT
SUT V-LOC 180 0-0 GS22 (SUTURE) ×1 IMPLANT
SUT VIC AB 0 CT1 27 (SUTURE)
SUT VIC AB 0 CT1 27XBRD ANTBC (SUTURE) IMPLANT
SUT VIC AB 2-0 CT1 27 (SUTURE) ×2
SUT VIC AB 2-0 CT1 TAPERPNT 27 (SUTURE) ×1 IMPLANT
SUT VIC AB 4-0 PS2 18 (SUTURE) ×4 IMPLANT
SUT VICRYL 0 27 CT2 27 ABS (SUTURE) ×2 IMPLANT
SUT VLOC 180 0 9IN  GS21 (SUTURE)
SUT VLOC 180 0 9IN GS21 (SUTURE) IMPLANT
SYR 10ML LL (SYRINGE) IMPLANT
SYS BAG RETRIEVAL 10MM (BASKET)
SYS RETRIEVAL 5MM INZII UNIV (BASKET) ×2
SYS WOUND ALEXIS 18CM MED (MISCELLANEOUS)
SYSTEM BAG RETRIEVAL 10MM (BASKET) IMPLANT
SYSTEM RETRIEVL 5MM INZII UNIV (BASKET) ×1 IMPLANT
SYSTEM WOUND ALEXIS 18CM MED (MISCELLANEOUS) IMPLANT
TOWEL OR NON WOVEN STRL DISP B (DISPOSABLE) IMPLANT
TRAP SPECIMEN MUCUS 40CC (MISCELLANEOUS) IMPLANT
TRAY FOLEY MTR SLVR 16FR STAT (SET/KITS/TRAYS/PACK) ×2 IMPLANT
TROCAR PORT AIRSEAL 5X120 (TROCAR) ×1 IMPLANT
UNDERPAD 30X36 HEAVY ABSORB (UNDERPADS AND DIAPERS) ×4 IMPLANT
WATER STERILE IRR 1000ML POUR (IV SOLUTION) ×2 IMPLANT
YANKAUER SUCT BULB TIP 10FT TU (MISCELLANEOUS) IMPLANT

## 2022-10-21 NOTE — Anesthesia Procedure Notes (Signed)
Procedure Name: Intubation Date/Time: 10/21/2022 8:35 AM  Performed by: Lind Covert, CRNAPre-anesthesia Checklist: Patient identified, Emergency Drugs available, Suction available, Patient being monitored and Timeout performed Patient Re-evaluated:Patient Re-evaluated prior to induction Oxygen Delivery Method: Circle system utilized Preoxygenation: Pre-oxygenation with 100% oxygen Induction Type: IV induction Ventilation: Mask ventilation without difficulty Laryngoscope Size: Mac and 4 Grade View: Grade I Tube type: Oral Tube size: 7.0 mm Number of attempts: 1 Airway Equipment and Method: Stylet Placement Confirmation: ETT inserted through vocal cords under direct vision, positive ETCO2 and breath sounds checked- equal and bilateral Secured at: 22 cm Tube secured with: Tape Dental Injury: Teeth and Oropharynx as per pre-operative assessment

## 2022-10-21 NOTE — Op Note (Signed)
OPERATIVE NOTE  Pre-operative Diagnosis: enlarged uterus, suspected fibroid  Post-operative Diagnosis: same, fundal uterine mass with significant necrosis  Operation: Robotic-assisted laparoscopic total hysterectomy with bilateral salpingectomy, laparotomy for specimen removal, cystoscopy   Surgeon: Jeral Pinch MD  Assistant Surgeon: Joylene John NP  Anesthesia: GET  Urine Output: 500 cc  Operative Findings: On EUA, normal cervix, minimal mobility of the uterus.  On intra-abdominal entry, normal upper abdominal survey. Enlarged uterus, 12 cm with multiple 4-6 cm intramural fibroids. Large, approximately 25 cm, necrotic appearing pedunculated mass originating from the uterine fundus (on a 4-5 cm stalk). No ascites. Normal appearing bilateral tubes and ovaries. Normal appearing omentum, small and large bowel, appendix.  On frozen section, uterine mass with significant necrosis. No overt malignancy noted, but defer to final pathology. Differential diagnosis includes leiomyoma, STUMP, and leiomyosarcoma. On cystoscopy, bladder dome intact, good efflux noted from bilateral ureteral orifices.  Estimated Blood Loss:  150 cc      Total IV Fluids: see I&O flowsheet         Specimens: uterus, cervix, bilateral tubes         Complications:  None apparent; patient tolerated the procedure well.         Disposition: PACU - hemodynamically stable.  Procedure Details  The patient was seen in the Holding Room. The risks, benefits, complications, treatment options, and expected outcomes were discussed with the patient.  The patient concurred with the proposed plan, giving informed consent.  The site of surgery properly noted/marked. The patient was identified as [place] and the procedure verified as a Robotic-assisted hysterectomy with bilateral salpingectomy, mini-laparotomy, possible staging.   After induction of anesthesia, the patient was draped and prepped in the usual sterile  manner. Patient was placed in supine position after anesthesia and draped and prepped in the usual sterile manner as follows: Her arms were tucked to her side with all appropriate precautions.  The shoulders were stabilized with padded shoulder blocks applied to the acromium processes.  The patient was placed in the semi-lithotomy position in [place].  The perineum and vagina were prepped with CholoraPrep. The patient was draped after the CholoraPrep had been allowed to dry for 3 minutes.  A Time Out was held and the above information confirmed.  The urethra was prepped with Betadine. Foley catheter was placed.  OG tube placement was confirmed and to suction.   Next, a 5 mm skin incision was made 1 cm below the subcostal margin in the midclavicular line.  The 5 mm Optiview port and scope was used for direct entry.  Opening pressure was under 10 mm CO2.  The abdomen was insufflated and the findings were noted as above.   At this point and all points during the procedure, the patient's intra-abdominal pressure did not exceed 15 mmHg. Next, an 8 mm skin incision was made just inferior to the umbilicus and a right and left port were placed about 8 cm lateral to the robot port on the right and left side.  A fourth arm was placed on the right.  A 5 mm airseal port was then placed more inferior to the LUQ port, between the peri-umbilical and left lateral port. All ports were placed under direct visualization.  The patient was placed in steep Trendelenburg.  The robot was then docked in the usual fashion.  Attention was then turned below. A sterile speculum was placed in the vagina.  The cervix was grasped with a single-tooth tenaculum. The cervix was dilated  with Kennon Rounds dilators.  The 3.5 Delineator uterine manipulator with a colpotomizer ring was placed without difficulty.  A pneum occluder balloon was placed over the manipulator.    The right and left peritoneum were opened parallel to the IP ligament to  open the retroperitoneal spaces bilaterally. The round ligaments were transected. The ureter was again noted to be on the medial leaf of the broad ligament.  The fallopian tube was elevated and electrocautery was used to cauterize and then transect just inferior to the fallopian tube, free it from the ovary. Both fallopian tubes were placed in an Endocatch bag and removed at the time of laparotomy. The utero-ovarian ligament was then skeletonized, cauterized and cut.    The posterior peritoneum was taken down to the level of the KOH ring.  The anterior peritoneum was also taken down.  The bladder flap was created to the level of the KOH ring.  For this dissection, both the 0 degree and 30 degree robotic scopes were utilized. The uterine artery on the right side was skeletonized, cauterized and cut in the normal manner.  A similar procedure was performed on the left.  The colpotomy was made and the uterus cervix were amputated from the vagina and moved some into the mid/upper abdomen.  Pedicles were inspected and excellent hemostasis was achieved.    The colpotomy at the vaginal cuff was closed with Vicryl with a figure of eight at each apex and 0 V-Loc to close the midline in running fashion.  Irrigation was used and excellent hemostasis was achieved.    The infraumbilical trocar was removed and the incision extended to a length of 14-16 cm with a scalpel. The incision was carried down to and through the fascia, with the abdomen insufflated, using monopolar electrocautery. The peritoneal incision was extended under direct visualization. All ports were removed. The endocatch bag with fallopian tubes was delivered through the incision. The uterus was delivered through the incision. Blunt dissection was then used to free the fundal and posterior portion of the uterine mass from adhesions to the omentum and small bowel/small bowel mesentery. Once freed, the large fundal uterine mass was able to be delivered  through the incision. The entire specimen was sent for frozen section.   The small bowel was run from the cecum to the ligament of Treitz. Multiple areas where small bowel and mesentery had been adherent to the uterine mass were noted, but no serosal disruption of mesenteric injury were noted. The abdomen and pelvis were copiously irrigated with excellent hemostasis. Specifically, the vaginal cuff and bilateral ovaries were inspected again with hemostasis noted. The upper abdomen was palpated without abnormality found.  Once the frozen section returned, the incision was then closed with running #1 looped PDS tied in the midline. The subcutaneous tissue was irrigated and hemostasis achieved. Exparel was injected for local anesthesia. The subcutaneous tissue was closed with 2-0 Vicyrl in running fashion.   The subcuticular tissue of all incisions was closed with 4-0 Vicryl and the skin was closed with 4-0 Monocryl in a subcuticular manner.  Dermabond was applied.  The laparotomy was then covered with a honeycomb dressing.  Cystoscopy was performed after the bladder was instilled with sterile fluid and foley catheter removed. After cystoscopy, a new foley catheter was inserted.  The vagina was swabbed with minimal bleeding noted.   All sponge, lap and needle counts were correct x  3.   The patient was transferred to the recovery room in stable condition.  Jeral Pinch, MD

## 2022-10-21 NOTE — Progress Notes (Signed)
61m IV Dilaudid wasted in stericycle, witnessed by JMartiniqueWright, LPN.

## 2022-10-21 NOTE — Transfer of Care (Signed)
Immediate Anesthesia Transfer of Care Note  Patient: Megan [name]  Procedure(s) Performed: DIAGNOSTIC LAPAROSCOPY, XI ROBOTIC ASSISTED HYSTERECTOMY WITH BILATERAL SALPINGECTOMY, EXPLORATORY LAPAROTOMY FOR SPECIMEN DELIVERY, CYSTOSCOPY (Bilateral)  Patient Location: PACU  Anesthesia Type:General  Level of Consciousness: sedated  Airway & Oxygen Therapy: Patient Spontanous Breathing and Patient connected to face mask oxygen  Post-op Assessment: Report given to RN and Post -op Vital signs reviewed and stable  Post vital signs: Reviewed and stable  Last Vitals:  Vitals Value Taken Time  BP 156/103 10/21/22 1255  Temp    Pulse 108 10/21/22 1257  Resp 25 10/21/22 1256  SpO2 100 % 10/21/22 1257  Vitals shown include unvalidated device data.  Last Pain:  Vitals:   10/21/22 0543  TempSrc: Oral  PainSc:       Patients Stated Pain Goal: 2 ([id number])  Complications: No notable events documented.

## 2022-10-21 NOTE — Anesthesia Postprocedure Evaluation (Signed)
Anesthesia Post Note  Patient: Megan [name]  Procedure(s) Performed: DIAGNOSTIC LAPAROSCOPY, XI ROBOTIC ASSISTED HYSTERECTOMY WITH BILATERAL SALPINGECTOMY, EXPLORATORY LAPAROTOMY FOR SPECIMEN DELIVERY, CYSTOSCOPY (Bilateral)     Patient location during evaluation: PACU Anesthesia Type: General Level of consciousness: awake and alert Pain management: pain level controlled Vital Signs Assessment: post-procedure vital signs reviewed and stable Respiratory status: spontaneous breathing, nonlabored ventilation, respiratory function stable and patient connected to nasal cannula oxygen Cardiovascular status: blood pressure returned to baseline and stable Postop Assessment: no apparent nausea or vomiting Anesthetic complications: no  No notable events documented.  Last Vitals:  Vitals:   10/21/22 1500 10/21/22 1602  BP: (!) 157/88 (!) 160/95  Pulse: 90 70  Resp: 18 16  Temp: 36.6 C   SpO2: 100% 100%    Last Pain:  Vitals:   10/21/22 1505  TempSrc:   PainSc: 10-Worst pain ever                 Jadin Creque L Ohana Birdwell

## 2022-10-21 NOTE — Progress Notes (Signed)
Report given to short stay.  

## 2022-10-21 NOTE — Interval H&P Note (Signed)
History and Physical Interval Note:  10/21/2022 6:52 AM  Megan [name]  has presented today for surgery, with the diagnosis of FIBROID TUMOR, ABNORMAL UTERINE BLEEDING.  The various methods of treatment have been discussed with the patient and family. After consideration of risks, benefits and other options for treatment, the patient has consented to  Procedure(s): DIAGNOSTIC LAPAROSCOPY, XI ROBOTIC ASSISTED VERSUS OPEN TOTAL HYSTERECTOMY WITH BILATERAL SALPINGECTOMY, POSSIBLE UNILATERAL VERSUS BILATERAL OOPHORECTOMY, MINI LAPAROTOMY IF ROBOTIC, POSSIBLE STAGING (Bilateral) POSSIBLE XI ROBOTIC ASSISTED MYOMECTOMY (N/A) as a surgical intervention.  The patient's history has been reviewed, patient examined, no change in status, stable for surgery.  I have reviewed the patient's chart and labs.  Questions were answered to the patient's satisfaction.     [name]

## 2022-10-21 NOTE — Brief Op Note (Signed)
10/21/2022  1:17 PM  PATIENT:  Megan [name]  37 y.o. female  PRE-OPERATIVE DIAGNOSIS:  FIBROID TUMOR, ABNORMAL UTERINE BLEEDING  POST-OPERATIVE DIAGNOSIS:  FIBROID TUMOR, ABNORMAL UTERINE BLEEDING  PROCEDURE:  Procedure(s): DIAGNOSTIC LAPAROSCOPY, XI ROBOTIC ASSISTED HYSTERECTOMY WITH BILATERAL SALPINGECTOMY, EXPLORATORY LAPAROTOMY FOR SPECIMEN DELIVERY, CYSTOSCOPY (Bilateral)  SURGEON:  Surgeon(s) and Role:    * [name], MD - Primary  ASSISTANTS:  Joylene John, NP  ANESTHESIA:   general  EBL:  150 mL   BLOOD ADMINISTERED: none  DRAINS: none   LOCAL MEDICATIONS USED:  marcaine, exparel  SPECIMEN:  uterus, cervix, bilateral tubes  DISPOSITION OF SPECIMEN:  pathology  COUNTS:  YES  TOURNIQUET:  * No tourniquets in log *  DICTATION: .Note written in EPIC  PLAN OF CARE: Admit to inpatient   PATIENT DISPOSITION:  PACU - hemodynamically stable.   Delay start of Pharmacological VTE agent (>24hrs) due to surgical blood loss or risk of bleeding: not applicable

## 2022-10-22 ENCOUNTER — Telehealth: Payer: Self-pay

## 2022-10-22 ENCOUNTER — Encounter (HOSPITAL_COMMUNITY): Payer: Self-pay | Admitting: Gynecologic Oncology

## 2022-10-22 LAB — CBC
HCT: 27.5 % — ABNORMAL LOW (ref 36.0–46.0)
Hemoglobin: 8.3 g/dL — ABNORMAL LOW (ref 12.0–15.0)
MCH: 25.7 pg — ABNORMAL LOW (ref 26.0–34.0)
MCHC: 30.2 g/dL (ref 30.0–36.0)
MCV: 85.1 fL (ref 80.0–100.0)
Platelets: 412 10*3/uL — ABNORMAL HIGH (ref 150–400)
RBC: 3.23 MIL/uL — ABNORMAL LOW (ref 3.87–5.11)
RDW: 15.3 % (ref 11.5–15.5)
WBC: 14.3 10*3/uL — ABNORMAL HIGH (ref 4.0–10.5)
nRBC: 0 % (ref 0.0–0.2)

## 2022-10-22 LAB — BASIC METABOLIC PANEL
Anion gap: 7 (ref 5–15)
BUN: 8 mg/dL (ref 6–20)
CO2: 27 mmol/L (ref 22–32)
Calcium: 8 mg/dL — ABNORMAL LOW (ref 8.9–10.3)
Chloride: 102 mmol/L (ref 98–111)
Creatinine, Ser: 1.07 mg/dL — ABNORMAL HIGH (ref 0.44–1.00)
GFR, Estimated: >60 mL/min (ref >60)
Glucose, Bld: 213 mg/dL — ABNORMAL HIGH (ref 70–99)
Potassium: 4.8 mmol/L (ref 3.5–5.1)
Sodium: 136 mmol/L (ref 135–145)

## 2022-10-22 MED ORDER — OXYCODONE HCL 5 MG PO TABS
5.0000 mg | ORAL_TABLET | ORAL | Status: DC | PRN
  Administered 2022-10-22 – 2022-10-23 (×3): 5 mg via ORAL
  Filled 2022-10-22 (×2): qty 1

## 2022-10-22 MED ORDER — KETOROLAC TROMETHAMINE 15 MG/ML IJ SOLN
15.0000 mg | Freq: Four times a day (QID) | INTRAMUSCULAR | Status: AC
  Administered 2022-10-22 – 2022-10-23 (×4): 15 mg via INTRAVENOUS
  Filled 2022-10-22 (×4): qty 1

## 2022-10-22 MED ORDER — OXYCODONE HCL 5 MG PO TABS
5.0000 mg | ORAL_TABLET | ORAL | Status: DC | PRN
  Administered 2022-10-22 – 2022-10-23 (×8): 5 mg via ORAL
  Filled 2022-10-22 (×8): qty 1

## 2022-10-22 MED ORDER — SIMETHICONE 80 MG PO CHEW
80.0000 mg | CHEWABLE_TABLET | Freq: Four times a day (QID) | ORAL | Status: DC
  Administered 2022-10-22 – 2022-10-23 (×6): 80 mg via ORAL
  Filled 2022-10-22 (×6): qty 1

## 2022-10-22 NOTE — Telephone Encounter (Signed)
Tanya in reception sent a message to a Ms Wall in scheduling to call back to Dr. Charisse March office at [phone] to set up an appointment for patient in 11/2 to 2 weeks from surgery 10-21-22 with Jeral Pinch.

## 2022-10-22 NOTE — Progress Notes (Signed)
2 Days Post-Op   Subjective: Patient reports overall struggling some with pain, describes this as feeling like abdominal wall muscle cramping.  Pain comes and goes intensely.  Tolerated liquids and dinner last night without nausea or emesis.  Was able to walk to the bathroom.  Has pain with movement but denies any dizziness.  Foley catheter still in place. + Flatus, thought she needed to have a bowel movement last night.  Objective: Vital signs in last 24 hours: Temp:  [97.1 F (36.2 C)-98.6 F (37 C)] 98.4 F (36.9 C) (02/22 0558) Pulse Rate:  [65-106] 74 (02/22 0558) Resp:  [11-22] 20 (02/22 0712) BP: (136-170)/(69-106) 136/79 (02/22 0558) SpO2:  [98 %-100 %] 98 % (02/22 0712) FiO2 (%):  [0 %-100 %] 100 % (02/22 0712) Last BM Date : 10/20/22  Intake/Output from previous day: 02/21 0701 - 02/22 0700 In: 4946 [P.O.:960; I.V.:3986] Out: 4250 [Urine:4100; Blood:150]  Physical Examination: General: No acute distress, alert and oriented HEENT: Normocephalic, atraumatic Cardiovascular: Regular rate and rhythm, no murmurs or rubs appreciated Pulmonary: Lungs are clear to auscultation bilaterally, no wheezes or rhonchi.  Nasal cannula in place. Abdomen: Soft, appropriately tender, nondistended, bowel sounds appreciated.  Honeycomb dressing in place, clean, dry, intact Extremities: SCDs in place, no calf tenderness to palpation, no edema  Labs:    Latest Ref Rng & Units 10/22/2022    4:58 AM 10/21/2022    4:28 AM 10/20/2022   11:46 AM  CBC  WBC 4.0 - 10.5 K/uL 14.3  16.3  10.6   Hemoglobin 12.0 - 15.0 g/dL 8.3  9.6  10.9   Hematocrit 36.0 - 46.0 % 27.5  32.0  35.7   Platelets 150 - 400 K/uL 412  485  556       Latest Ref Rng & Units 10/22/2022    4:58 AM 10/21/2022    4:28 AM 10/20/2022   11:46 AM  BMP  Glucose 70 - 99 mg/dL 213  138  103   BUN 6 - 20 mg/dL 8  11  11   $ Creatinine 0.44 - 1.00 mg/dL 1.07  0.95  1.09   Sodium 135 - 145 mmol/L 136  134  139   Potassium 3.5 - 5.1  mmol/L 4.8  4.5  4.0   Chloride 98 - 111 mmol/L 102  101  100   CO2 22 - 32 mmol/L 27  22  24   $ Calcium 8.9 - 10.3 mg/dL 8.0  8.7  9.3     Assessment:  37 y.o. s/p [place] followed by TRH/BS with laparotomy for specimen delivery in the setting of a massive uterine mass. Frozen section limited by degree of necrosis, no overt malignancy noted.  Postop: Patient is overall doing well, meeting early milestones.  Will DC Foley today.  Discussed sitting in the chair for meals, ambulating.  Pain: Pain is not well-controlled with PCA.  Given tolerating p.o., will transition to oral pain medication.  Will also add IV Toradol.  Given creatinine, will give lower dose.  Discussed adding simethicone and working on ambulation as I suspect, given her description and her abdominal exam that pain is related to gas. Patient establish care with pain management clinic prior to surgery.  Given history of narcotic dependence (per chart notes reviewed by my team) as well as now almost a year of being on oxycodone, anticipate will be challenging to wean her off of narcotics.  Will plan for her to follow-up with pain clinic within 2-3 weeks of surgery.  Acute on chronic anemia: This is expected in the setting of surgical blood loss as well as significant hemodilution.  Vital signs are stable, patient denies any dizziness.  Will continue to trend CBC.  Plan to discharge home with iron.  Hyperglycemia: In the setting of recent procedures and receiving steroids.  Will continue to follow blood glucose, anticipate this will decrease over the next couple of days.  Leukocytosis: Likely reactive in the setting of recent surgery.  Continue to follow.  No signs or symptoms of infection.  Plan: Work on advancing milestones. The patient is to be discharged to home.  Anticipate discharge either 2/23 versus 2/24.   LOS: 2 days    [name] 10/22/2022, 7:49 AM

## 2022-10-22 NOTE — TOC CM/SW Note (Signed)
Transition of Care (TOC) Screening Note  Patient Details  Name: Megan [name] Date of Birth: 27-Apr-1986  Transition of Care [facility]) CM/SW Contact:    Sherie Don, LCSW Phone Number: 10/22/2022, 9:42 AM  Transition of Care Department [facility]) has reviewed patient and no TOC needs have been identified at this time. We will continue to monitor patient advancement through interdisciplinary progression rounds. If new patient transition needs arise, please place a TOC consult.

## 2022-10-22 NOTE — Telephone Encounter (Signed)
Office received a call from [place] of Dr. Redmond Baseman at [facility]. She states Megan [name] is scheduled for an appointment on 2/29. They will get patient to sign a pain contract at that time (was not obtained at the 2/16 appointment). Leonides Sake aware to fax 2/29 office note and copy of pain contract. She states an understanding. Our office fax number was given to [facility].

## 2022-10-22 NOTE — Telephone Encounter (Signed)
LM  for CMA name not identified that Dr. Berline Lopes appreciated them seeing her on 10-22-22. Tey want to notify Hermenia Bers that Ms Befort reports that she has been receiving Oxycodone for about a year. Joylene John, NP wanted to make them aware of the fact that notes in Epic chart date back to 2013 when she was using Oxycodone then. We also left a message for Ms Wall to call back to our office to sent up an appointment with Modena Slater in 1-1/2 to 2 weeks from gyn surgery on 10-21-22.

## 2022-10-23 LAB — URINALYSIS, COMPLETE (UACMP) WITH MICROSCOPIC
Bacteria, UA: NONE SEEN
Bilirubin Urine: NEGATIVE
Glucose, UA: NEGATIVE mg/dL
Ketones, ur: NEGATIVE mg/dL
Nitrite: NEGATIVE
Protein, ur: NEGATIVE mg/dL
Specific Gravity, Urine: 1.005 (ref 1.005–1.030)
pH: 7 (ref 5.0–8.0)

## 2022-10-23 LAB — CBC
HCT: 31.7 % — ABNORMAL LOW (ref 36.0–46.0)
Hemoglobin: 9.8 g/dL — ABNORMAL LOW (ref 12.0–15.0)
MCH: 25.3 pg — ABNORMAL LOW (ref 26.0–34.0)
MCHC: 30.9 g/dL (ref 30.0–36.0)
MCV: 81.9 fL (ref 80.0–100.0)
Platelets: 490 10*3/uL — ABNORMAL HIGH (ref 150–400)
RBC: 3.87 MIL/uL (ref 3.87–5.11)
RDW: 15.2 % (ref 11.5–15.5)
WBC: 12.2 10*3/uL — ABNORMAL HIGH (ref 4.0–10.5)
nRBC: 0 % (ref 0.0–0.2)

## 2022-10-23 LAB — BASIC METABOLIC PANEL
Anion gap: 9 (ref 5–15)
BUN: 8 mg/dL (ref 6–20)
CO2: 24 mmol/L (ref 22–32)
Calcium: 8.5 mg/dL — ABNORMAL LOW (ref 8.9–10.3)
Chloride: 103 mmol/L (ref 98–111)
Creatinine, Ser: 0.91 mg/dL (ref 0.44–1.00)
GFR, Estimated: >60 mL/min (ref >60)
Glucose, Bld: 119 mg/dL — ABNORMAL HIGH (ref 70–99)
Potassium: 4.1 mmol/L (ref 3.5–5.1)
Sodium: 136 mmol/L (ref 135–145)

## 2022-10-23 MED ORDER — IBUPROFEN 800 MG PO TABS: 800.0000 mg | ORAL_TABLET | Freq: Three times a day (TID) | ORAL | 2 refills | Status: DC | PRN

## 2022-10-23 MED ORDER — SIMETHICONE 80 MG PO CHEW: 80.0000 mg | CHEWABLE_TABLET | Freq: Four times a day (QID) | ORAL | 1 refills | Status: AC

## 2022-10-23 MED ORDER — POLYETHYLENE GLYCOL 3350 17 G PO PACK: 17.0000 g | PACK | Freq: Every day | ORAL | 3 refills | Status: AC

## 2022-10-23 NOTE — Progress Notes (Signed)
Gynecologic Oncology Progress Note  3 Days Post-Op from robotic-assisted laparoscopic total hysterectomy with bilateral salpingectomy, laparotomy for specimen removal, cystoscopy with Dr. Jeral Pinch on 10/21/22 for a fundal uterine mas with significant necrosis.    Subjective: Patient reports having a bowel movement and passing flatus.  States she likes using the MiraLAX and would like this at discharge along with a prescription for simethicone.  She states she is having intermittent hot flashes.  Denies dizziness or lightheadedness when up moving around but states sometimes feel dizzy while sitting on the toilet.  States she was up all night with urination issues.  She states that she feels like she needs to urinate and has little come out unless she forces it and bears down.  Had some light vaginal pink spotting but denies heavy bleeding.  Ambulated in the halls yesterday without difficulty.  She is tolerating her diet with no nausea or emesis.  Reports pain control with oral pain medications.  Objective: Vital signs in last 24 hours: Temp:  [97.8 F (36.6 C)-98.4 F (36.9 C)] 98.2 F (36.8 C) (02/23 0451) Pulse Rate:  [74-86] 83 (02/23 0451) Resp:  [16-18] 18 (02/23 0451) BP: (127-150)/(68-88) 127/68 (02/23 0451) SpO2:  [98 %-100 %] 100 % (02/23 0451) Last BM Date : 10/23/22  Intake/Output from previous day: 02/22 0701 - 02/23 0700 In: 1561.5 [P.O.:1560; I.V.:1.5] Out: 1250 [Urine:1250]  Physical Examination: General: alert, cooperative, and no distress Resp: clear to auscultation bilaterally Cardio: regular rate and rhythm, S1, S2 normal, no murmur, click, rub or gallop GI: soft, non-tender; bowel sounds normal; no masses,  no organomegaly and incision: lap sites to the abdomen with dermabond intact. Midline incision with op site dressing in place and intact with no drainage noted underneath. Binder in place. Extremities: extremities normal, atraumatic, no cyanosis or  edema  Labs: WBC/Hgb/Hct/Plts:  12.2/9.8/31.7/490 (02/23 0431) BUN/Cr/glu/ALT/AST/amyl/lip:  8/0.91/--/--/--/--/-- (02/23 0431)  Assessment: 37 y.o. s/p robotic-assisted laparoscopic total hysterectomy with bilateral salpingectomy, laparotomy for specimen removal, cystoscopy with Dr. Jeral Pinch on 10/21/22 for a fundal uterine mas with significant necrosis: stable Pain:  Pain is well-controlled on PRN medications.  Heme: Hgb 9.8 and Hct 31.7 this am. Stable.   ID: No evidence of infection at this time. Will plan to obtain urine sample given frequency symptoms.  CV: BP and HR stable. Continue to monitor with ordered vital signs   GI:  Tolerating po: Yes. Antiemetics ordered if needed.  GU: Voiding with frequency reported and difficulty emptying bladder without bearing down.    FEN: No critical values on am labs.  Endo: Glucose numbers are improving with glucose at 119 this am on Bmet  Prophylaxis: SCDS and lovenox ordered.  Plan: Plan for bladder scan x2 after attempting void Urine sample to rule out infection If continuing to do well, plan for possible discharge home later today   LOS: 3 days    Dorothyann Gibbs 10/23/2022, 9:12 AM

## 2022-10-23 NOTE — Discharge Summary (Signed)
Physician Discharge Summary  Patient ID: Yoshika Pesantes MRN: [id number] DOB/AGE: 10-31-1985 37 y.o.  Admit date: 10/20/2022 Discharge date: 10/23/2022  Admission Diagnoses: Fibroid tumor  Discharge Diagnoses:  Principal Problem:   Fibroid tumor   Discharged Condition:  The patient is in good condition and stable for discharge.    Hospital Course: On 10/20/2022, the patient underwent the following: Robotic-assisted laparoscopic total hysterectomy with bilateral salpingectomy, laparotomy for specimen removal, cystoscopy. The postoperative course was uneventful.  She was discharged to home on postoperative day 2 tolerating a regular diet, having bowel movements and passing flatus, pain controlled with oral medications, foley replaced due to acute urinary retention with plans for removal at the beginning of next week at the [place] clinic. She has follow up with [facility] in several weeks as well.   Consults: None  Significant Diagnostic Studies: Labs  Treatments: Surgery: see above  Discharge Exam (from am assessment): Blood pressure 133/78, pulse 87, temperature 98.8 F (37.1 C), temperature source Oral, resp. rate 16, height '5\' 8"'$  (1.727 m), weight 257 lb 12.8 oz (116.9 kg), SpO2 100 %. General: alert, cooperative, and no distress Resp: clear to auscultation bilaterally Cardio: regular rate and rhythm, S1, S2 normal, no murmur, click, rub or gallop GI: soft, non-tender; bowel sounds normal; no masses,  no organomegaly and incision: lap sites to the abdomen with dermabond intact. Midline incision with op site dressing in place and intact with no drainage noted underneath. Binder in place. Extremities: extremities normal, atraumatic, no cyanosis or edema  Disposition: Discharge disposition: 01-Home or Self Care       Discharge Instructions     Call MD for:  difficulty breathing, headache or visual disturbances   Complete by: As directed    Call MD for:  extreme  fatigue   Complete by: As directed    Call MD for:  hives   Complete by: As directed    Call MD for:  persistant dizziness or light-headedness   Complete by: As directed    Call MD for:  persistant nausea and vomiting   Complete by: As directed    Call MD for:  redness, tenderness, or signs of infection (pain, swelling, redness, odor or green/yellow discharge around incision site)   Complete by: As directed    Call MD for:  severe uncontrolled pain   Complete by: As directed    Call MD for:  temperature >100.4   Complete by: As directed    Diet - low sodium heart healthy   Complete by: As directed    Discharge wound care:   Complete by: As directed    You will have a white honeycomb dressing over your larger incision. This dressing can be removed 5 days after surgery and you do not need to reapply a new dressing. Once you remove the dressing, you will notice that you have the surgical glue (dermabond) on the incision and this will peel off on its own. You can get this dressing wet in the shower the days after surgery prior to removal on the 5th day.   Driving Restrictions   Complete by: As directed    No driving for around 2 week(s).  Do not take narcotics and drive. You need to make sure your reaction time has returned.   Increase activity slowly   Complete by: As directed    Lifting restrictions   Complete by: As directed    No lifting greater than 10 lbs, pushing, pulling, straining  for 6 weeks.   Sexual Activity Restrictions   Complete by: As directed    No sexual activity, nothing in the vagina, for 10-12 weeks.      Allergies as of 10/23/2022       Reactions   Penicillins Other (See Comments)   Unknown reaction, but since childhood- patient stated she has had penicillin with no complications   Tramadol Nausea Only        Medication List     STOP taking these medications    fluconazole 150 MG tablet Commonly known as: Diflucan       TAKE these medications     ibuprofen 800 MG tablet Commonly known as: ADVIL Take 1 tablet (800 mg total) by mouth every 8 (eight) hours as needed for moderate pain. What changed:  medication strength when to take this reasons to take this   loratadine 10 MG tablet Commonly known as: CLARITIN Take 10 mg by mouth daily as needed for allergies or rhinitis.   ondansetron 4 MG disintegrating tablet Commonly known as: ZOFRAN-ODT Take 4 mg by mouth every 8 (eight) hours as needed for nausea or vomiting (DISSOLVE ORALLY).   ondansetron 4 MG tablet Commonly known as: Zofran Take 1 tablet (4 mg total) by mouth every 8 (eight) hours as needed for nausea or vomiting.   oxyCODONE 5 MG immediate release tablet Commonly known as: Oxy IR/ROXICODONE Take 1 tablet (5 mg total) by mouth every 4 (four) hours as needed for severe pain. For AFTER surgery only., Only take for severe pain, Do not take and drive, do not take with other pain medications What changed: Another medication with the same name was removed. Continue taking this medication, and follow the directions you see here.   polyethylene glycol 17 g packet Commonly known as: MiraLax Take 17 g by mouth daily. Can increase to twice daily if needed What changed: additional instructions   senna-docusate 8.6-50 MG tablet Commonly known as: Senokot-S Take 2 tablets by mouth at bedtime. For AFTER surgery, do not take if having diarrhea   sertraline 25 MG tablet Commonly known as: ZOLOFT Take 25 mg by mouth daily.   simethicone 80 MG chewable tablet Commonly known as: MYLICON Chew 1 tablet (80 mg total) by mouth 4 (four) times daily.   triamcinolone cream 0.1 % Commonly known as: KENALOG Apply 1 Application topically 2 (two) times daily. What changed:  when to take this additional instructions               Discharge Care Instructions  (From admission, onward)           Start     Ordered   10/23/22 0000  Discharge wound care:       Comments:  You will have a white honeycomb dressing over your larger incision. This dressing can be removed 5 days after surgery and you do not need to reapply a new dressing. Once you remove the dressing, you will notice that you have the surgical glue (dermabond) on the incision and this will peel off on its own. You can get this dressing wet in the shower the days after surgery prior to removal on the 5th day.   10/23/22 [facility] Follow up on 10/27/2022.   Specialty: Gynecologic Oncology Why: at 11 am at the [facility] for voiding trial and foley removal with our  RN. Minette Brine information: [address] [id number] [place]        [name], MD Follow up on 10/28/2022.   Specialty: Gynecologic Oncology Why: at 4:20pm will be a PHONE visit with Dr. Berline Lopes to check in and discuss path. Contact information: [place] [address] [phone]         Joylene John D, NP Follow up on 11/20/2022.   Specialty: Gynecologic Oncology Why: at 9 am at the [facility] for post-op check. Contact information: [address] [place] [address] [phone]                 Greater than thirty minutes were spend for face to face discharge instructions and discharge orders/summary in EPIC.   Signed: Dorothyann Gibbs 10/23/2022, 2:39 PM

## 2022-10-23 NOTE — Telephone Encounter (Signed)
Pt.'s  appointment with Hermenia Bers at [facility] is at 0930 on 10-29-22.

## 2022-10-23 NOTE — Progress Notes (Signed)
Nurse reviewed discharge instructions with pt. Pt verbalized understanding of discharge instructions, follow up appointments and new medications.  Nurse inserted foley catheter prior to discharge and pt will follow up for voiding trial.  Pt instructed on how to do foley care and maintenance of catheter. Pt verbalized understanding and discharged.

## 2022-10-23 NOTE — Discharge Instructions (Signed)
AFTER SURGERY INSTRUCTIONS   Return to work: 4-6 weeks if applicable  You will have a white honeycomb dressing over your larger incision. This dressing can be removed 5 days after surgery and you do not need to reapply a new dressing. Once you remove the dressing, you will notice that you have the surgical glue (dermabond) on the incision and this will peel off on its own. You can get this dressing wet in the shower the days after surgery prior to removal on the 5th day.   Plan to come back to the office at the [facility] on Tuesday for foley removal and voiding trial. Over the weekend, plan to empty the foley and call for no output, increased pressure in the bladder region, bleeding.   Activity: 1. Be up and out of the bed during the day.  Take a nap if needed.  You may walk up steps but be careful and use the hand rail.  Stair climbing will tire you more than you think, you may need to stop part way and rest.    2. No lifting or straining for 6 weeks over 10 pounds. No pushing, pulling, straining for 6 weeks.   3. No driving for around 2 week(s).  Do not drive if you are taking narcotic pain medicine and make sure that your reaction time has returned.    4. You can shower as soon as the next day after surgery. Shower daily.  Use your regular soap and water (not directly on the incision) and pat your incision(s) dry afterwards; don't rub.  No tub baths or submerging your body in water until cleared by your surgeon. If you have the soap that was given to you by pre-surgical testing that was used before surgery, you do not need to use it afterwards because this can irritate your incisions.    5. No sexual activity and nothing in the vagina for 12 weeks.   6. You may experience a small amount of clear drainage from your incisions, which is normal.  If the drainage persists, increases, or changes color please call the office.   7. Do not use creams, lotions, or ointments such as neosporin on  your incisions after surgery until advised by your surgeon because they can cause removal of the dermabond glue on your incisions.     8. You may experience vaginal spotting after surgery or around the 6-8 week mark from surgery when the stitches at the top of the vagina begin to dissolve.  The spotting is normal but if you experience heavy bleeding, call our office.   9. Take Tylenol or ibuprofen first for pain if you are able to take these medications and only use narcotic pain medication for severe pain not relieved by the Tylenol or Ibuprofen.  Monitor your Tylenol intake to a max of 4,000 mg in a 24 hour period. You can alternate these medications after surgery.   Diet: 1. Low sodium Heart Healthy Diet is recommended but you are cleared to resume your normal (before surgery) diet after your procedure.   2. It is safe to use a laxative, such as Miralax or Colace, if you have difficulty moving your bowels. You have been prescribed Sennakot-S to take at bedtime every evening after surgery to keep bowel movements regular and to prevent constipation.     Wound Care: 1. Keep clean and dry.  Shower daily.   Reasons to call the Doctor: Fever - Oral temperature greater than 100.4 degrees  Fahrenheit Foul-smelling vaginal discharge Difficulty urinating Nausea and vomiting Increased pain at the site of the incision that is unrelieved with pain medicine. Difficulty breathing with or without chest pain New calf pain especially if only on one side Sudden, continuing increased vaginal bleeding with or without clots.   Contacts: For questions or concerns you should contact:   Dr. Jeral Pinch at [phone]   Joylene John, NP at [phone]   After Hours: call [phone] and have the GYN Oncologist paged/contacted (after 5 pm or on the weekends).   Messages sent via mychart are for non-urgent matters and are not responded to after hours so for urgent needs, please call the after hours  number.

## 2022-10-26 ENCOUNTER — Telehealth: Payer: Self-pay | Admitting: Surgery

## 2022-10-26 ENCOUNTER — Telehealth: Payer: Self-pay

## 2022-10-26 ENCOUNTER — Inpatient Hospital Stay: Payer: Medicaid Other | Admitting: Licensed Clinical Social Worker

## 2022-10-26 DIAGNOSIS — Z79899 Other long term (current) drug therapy: Secondary | ICD-10-CM | POA: Diagnosis not present

## 2022-10-26 DIAGNOSIS — Z6833 Body mass index (BMI) 33.0-33.9, adult: Secondary | ICD-10-CM | POA: Diagnosis not present

## 2022-10-26 DIAGNOSIS — F419 Anxiety disorder, unspecified: Secondary | ICD-10-CM | POA: Diagnosis not present

## 2022-10-26 DIAGNOSIS — D259 Leiomyoma of uterus, unspecified: Secondary | ICD-10-CM | POA: Diagnosis not present

## 2022-10-26 LAB — SURGICAL PATHOLOGY

## 2022-10-26 NOTE — Transitions of Care (Post Inpatient/ED Visit) (Signed)
   10/26/2022  Name: Megan [name] MRN: [id number] DOB: 23-Jan-1986  Today's TOC FU Call Status: Today's TOC FU Call Status:: Successful TOC FU Call Competed TOC FU Call Complete Date: 10/26/22  Transition Care Management Follow-up Telephone Call Date of Discharge: 10/23/22 Discharge Facility: [facility]) Type of Discharge: Inpatient Admission Primary Inpatient Discharge Diagnosis:: Fibroid tumor How have you been since you were released from the hospital?: Better Any questions or concerns?: No  Items Reviewed: Did you receive and understand the discharge instructions provided?: Yes Medications obtained and verified?: Yes (Medications Reviewed) Any new allergies since your discharge?: No Dietary orders reviewed?: Yes Do you have support at home?: Yes  Home Care and Equipment/Supplies: [place] Ordered?: No Any new equipment or medical supplies ordered?: No  Functional Questionnaire: Do you need assistance with bathing/showering or dressing?: No Do you need assistance with meal preparation?: No Do you need assistance with eating?: No Do you have difficulty maintaining continence: No Do you need assistance with getting out of bed/getting out of a chair/moving?: No Do you have difficulty managing or taking your medications?: No  Folllow up appointments reviewed: PCP Follow-up appointment confirmed?: No (specialist) MD Provider Line Number:[phone] Given: Yes [facility] Follow-up appointment confirmed?: Yes Date of Specialist follow-up appointment?: 10/28/22 Follow-Up Specialty Provider:: Dr Berline Lopes Do you need transportation to your follow-up appointment?: No Do you understand care options if your condition(s) worsen?: Yes-patient verbalized understanding    [place] LPN [place] Direct Dial [phone]

## 2022-10-26 NOTE — Progress Notes (Signed)
[place] CSW Progress Note  Clinical Education officer, museum contacted patient by phone to follow-up on coping post-surgery. Patient reports to overall be doing well. She is hoping for a smooth healing process so she can return to regular activities and work.  Biggest stress currently is a light bill and hearing from [place] that they do not have funding for assistance. CSW reminded pt of potential benefit through insurance with a utility bill and pt stated she will call them tomorrow.   No other needs at this time. CSW encouraged pt to reach out for any future support needs.    Tyreak Reagle E Destanie Tibbetts, LCSW    Patient is participating in a Managed Medicaid Plan:  Yes

## 2022-10-26 NOTE — Telephone Encounter (Signed)
Spoke with Megan [name] today. She states she is eating, drinking and urinating well. She has not had a BM yet but is passing gas. She is taking senokot as prescribed and encouraged her to drink plenty of water. She denies fever or chills. Incisions are dry and intact. She rates her pain 7/10. Her pain is controlled with Oxycodone.    Instructed to call office with any fever, chills, purulent drainage, uncontrolled pain or any other questions or concerns. Patient verbalizes understanding.   Pt aware of post op appointments as well as the office number [phone] and after hours number [phone] to call if she has any questions or concerns.

## 2022-10-27 ENCOUNTER — Telehealth: Payer: Self-pay | Admitting: *Deleted

## 2022-10-27 ENCOUNTER — Inpatient Hospital Stay: Payer: Medicaid Other

## 2022-10-27 NOTE — Telephone Encounter (Signed)
Thanks for the update. The longer the catheter stays in, the more at risk she is for a urine infection. Its really important she come in tomorrow so that we can try to remove it.

## 2022-10-27 NOTE — Telephone Encounter (Signed)
Spoke with the patient regarding her missed appt for today. Patient stated "I have things going on and trying to pay a bill. " Ask the patient if she wants the appt moved to later today or tomorrow. Per patient request, appt moved to tomorrow.

## 2022-10-28 ENCOUNTER — Encounter (HOSPITAL_COMMUNITY): Payer: Self-pay

## 2022-10-28 ENCOUNTER — Other Ambulatory Visit: Payer: Self-pay

## 2022-10-28 ENCOUNTER — Inpatient Hospital Stay (HOSPITAL_BASED_OUTPATIENT_CLINIC_OR_DEPARTMENT_OTHER): Payer: Medicaid Other

## 2022-10-28 ENCOUNTER — Telehealth: Payer: Self-pay | Admitting: Oncology

## 2022-10-28 ENCOUNTER — Inpatient Hospital Stay (HOSPITAL_BASED_OUTPATIENT_CLINIC_OR_DEPARTMENT_OTHER): Payer: Medicaid Other | Admitting: Gynecologic Oncology

## 2022-10-28 ENCOUNTER — Inpatient Hospital Stay: Payer: Medicaid Other

## 2022-10-28 VITALS — BP 140/84 | HR 94 | Temp 98.4°F | Resp 18 | Ht 67.0 in | Wt 251.0 lb

## 2022-10-28 DIAGNOSIS — N939 Abnormal uterine and vaginal bleeding, unspecified: Secondary | ICD-10-CM | POA: Diagnosis not present

## 2022-10-28 DIAGNOSIS — D259 Leiomyoma of uterus, unspecified: Secondary | ICD-10-CM | POA: Diagnosis not present

## 2022-10-28 DIAGNOSIS — B379 Candidiasis, unspecified: Secondary | ICD-10-CM

## 2022-10-28 DIAGNOSIS — K5900 Constipation, unspecified: Secondary | ICD-10-CM | POA: Diagnosis not present

## 2022-10-28 DIAGNOSIS — D500 Iron deficiency anemia secondary to blood loss (chronic): Secondary | ICD-10-CM | POA: Diagnosis not present

## 2022-10-28 DIAGNOSIS — R319 Hematuria, unspecified: Secondary | ICD-10-CM

## 2022-10-28 DIAGNOSIS — F1120 Opioid dependence, uncomplicated: Secondary | ICD-10-CM | POA: Diagnosis not present

## 2022-10-28 DIAGNOSIS — B3731 Acute candidiasis of vulva and vagina: Secondary | ICD-10-CM | POA: Diagnosis not present

## 2022-10-28 DIAGNOSIS — R6881 Early satiety: Secondary | ICD-10-CM | POA: Diagnosis not present

## 2022-10-28 DIAGNOSIS — R140 Abdominal distension (gaseous): Secondary | ICD-10-CM | POA: Diagnosis not present

## 2022-10-28 DIAGNOSIS — D219 Benign neoplasm of connective and other soft tissue, unspecified: Secondary | ICD-10-CM

## 2022-10-28 DIAGNOSIS — R102 Pelvic and perineal pain: Secondary | ICD-10-CM | POA: Diagnosis not present

## 2022-10-28 LAB — URINALYSIS, COMPLETE (UACMP) WITH MICROSCOPIC
Bilirubin Urine: NEGATIVE
Glucose, UA: NEGATIVE mg/dL
Ketones, ur: NEGATIVE mg/dL
Nitrite: NEGATIVE
Protein, ur: NEGATIVE mg/dL
RBC / HPF: >50 RBC/hpf (ref 0–5)
Specific Gravity, Urine: 1.004 — ABNORMAL LOW (ref 1.005–1.030)
pH: 5 (ref 5.0–8.0)

## 2022-10-28 MED ORDER — FLUCONAZOLE 150 MG PO TABS: 150.0000 mg | ORAL_TABLET | Freq: Every day | ORAL | 0 refills | Status: DC

## 2022-10-28 NOTE — Progress Notes (Signed)
Megan [name] is a 37 y.o. female who underwent a robotic-assisted laparoscopic total hysterectomy with bilateral salpingectomy, laparotomy for specimen removal, cystoscopy on 10/21/2022 with Dr. Jeral Pinch.  She presents to the office today for foley catheter removal and trial voiding.  She states she has been doing well at home and is feeling much better.  Tolerating diet with no nausea or emesis.  She states her pain is better with only her incision aching at night. Her honeycomb dressing is still on and was removed per patient request.  Incision intact without redness or drainage. She reports having multiple bowel movements since yesterday and said her last was more formed and normal.  She states she has had adequate urine output in the catheter at home.  30 cc's bright red blood noted in the catheter bag. She reports the bleeding started today and thinks it is from irritation.  Notified Dr. Berline Lopes who recommended proceeding with the voiding trial, taking the catheter out and sending a sample for urine culture.  She denies having any vaginal bleeding, chest pain or shortness of breath. No lower extremity edema reported.   Exam: Alert, oriented x 3, in no acute distress. 210 cc of sterile normal saline instilled in the bladder via the catheter.  Patient tolerated this well.  Foley was removed without difficulty. The patient was able to void 150 cc after foley removal.  Assessment/Plan: 37 y.o. s/p robotic assisted laparoscopic total hysterectomy with bil salpingectomy presenting to the office for foley catheter removal and voiding trial.  She was able to void 150 cc without difficulty after installation of 210 cc of sterile normal saline.  Reportable signs and symptoms reviewed including signs of urinary retention.  She is advised to attempt to void every 2-3 hours and to record her output. This appointment is included in the global surgical bundle and has no charge.

## 2022-10-28 NOTE — Patient Instructions (Signed)
It was good to see you today.  Today we removed your urinary catheter.  It is important to try to void every 2/3 hours and track your urinary output. If you have difficulty voiding, are not able to empty your bladder or have pain, odor, fever or chills please notify our office at [phone].  If the office is closed, please call the after hours number [phone] and ask to speak with the gyn oncologist on call. Please do not hesitate to contact us with any questions or concerns.

## 2022-10-28 NOTE — Telephone Encounter (Signed)
Called [place] and she is able to come in at 8:00 tomorrow for a repeat UA/culture.  She reports she has been able to urinate since getting home and denies seeing any blood.  She does report having burning when she urinates.  Advised this may be from irritation from the catheter and we will check in with her tomorrow when she is here for labs.

## 2022-10-28 NOTE — Progress Notes (Signed)
Gynecologic Oncology Telehealth Note: Gyn-Onc  I connected with Megan [name] on 10/28/22 at  4:20 PM EST by telephone and verified that I am speaking with the correct person using two identifiers.  I discussed the limitations, risks, security and privacy concerns of performing an evaluation and management service by telemedicine and the availability of in-person appointments. I also discussed with the patient that there may be a patient responsible charge related to this service. The patient expressed understanding and agreed to proceed.  Other persons participating in the visit and their role in the encounter: none.  Patient's location: home Provider's location: [facility]  Reason for Visit: follow-up after surgery  Treatment History: 10/21/22: Robotic-assisted laparoscopic total hysterectomy with bilateral salpingectomy, laparotomy for specimen removal, cystoscopy    Interval History: Doing well.  Some irritation when she voids. Pain well controlled. Suboxone started, using ibuprofen.  Some muscle spasms around larger incision. Bowel movements most days since discharge. Tolerating PO, no nausea and emesis.   Past Medical/Surgical History: Past Medical History:  Diagnosis Date   Anxiety    Arthritis    Depression    Drug dependence ([place])    Tooth infection 01/28/2022    Past Surgical History:  Procedure Laterality Date   ENDOMETRIAL BIOPSY  03/09/2022   IR 3D INDEPENDENT WKST  10/20/2022   IR ANGIOGRAM PELVIS SELECTIVE OR SUPRASELECTIVE  10/20/2022   IR ANGIOGRAM SELECTIVE EACH ADDITIONAL VESSEL  10/20/2022   IR ANGIOGRAM SELECTIVE EACH ADDITIONAL VESSEL  10/20/2022   IR ANGIOGRAM SELECTIVE EACH ADDITIONAL VESSEL  10/20/2022   IR EMBO TUMOR ORGAN ISCHEMIA INFARCT INC GUIDE ROADMAPPING  10/20/2022   IR RADIOLOGIST EVAL & MGMT  10/06/2022   IR US GUIDE VASC ACCESS LEFT  10/20/2022   LIPOMA EXCISION  2009   on forehead   ROBOTIC ASSISTED TOTAL HYSTERECTOMY WITH BILATERAL SALPINGO  OOPHERECTOMY Bilateral 10/21/2022   Procedure: DIAGNOSTIC LAPAROSCOPY, XI ROBOTIC ASSISTED HYSTERECTOMY WITH BILATERAL SALPINGECTOMY, EXPLORATORY LAPAROTOMY FOR SPECIMEN DELIVERY, CYSTOSCOPY;  Surgeon: [name], MD;  Location: [facility];  Service: Gynecology;  Laterality: Bilateral;    Family History  Problem Relation Age of Onset   Bone cancer Maternal Aunt    Colon cancer Neg Hx    Breast cancer Neg Hx    Ovarian cancer Neg Hx    Endometrial cancer Neg Hx    Pancreatic cancer Neg Hx    Prostate cancer Neg Hx     Social History   Socioeconomic History   Marital status: Single    Spouse name: Not on file   Number of children: Not on file   Years of education: Not on file   Highest education level: Not on file  Occupational History   Occupation: on leave from work  Tobacco Use   Smoking status: Every Day    Packs/day: 0.50    Years: 18.00    Total pack years: 9.00    Types: Cigarettes   Smokeless tobacco: Never  Vaping Use   Vaping Use: Never used  Substance and Sexual Activity   Alcohol use: Not Currently    Alcohol/week: 4.0 standard drinks of alcohol    Types: 4 Cans of beer per week    Comment: weekly   Drug use: Yes    Types: Other-see comments    Comment: CBD eatables   Sexual activity: Not Currently  Other Topics Concern   Not on file  Social History Narrative   Not on file   Social Determinants of Health   Financial  Resource Strain: High Risk (10/13/2022)   Overall Financial Resource Strain (CARDIA)    Difficulty of Paying Living Expenses: Very hard  Food Insecurity: No Food Insecurity (10/20/2022)   Hunger Vital Sign    Worried About Running Out of Food in the Last Year: Never true    [place] in the Last Year: Never true  Recent Concern: Food Insecurity - Food Insecurity Present (10/13/2022)   Hunger Vital Sign    Worried About Running Out of Food in the Last Year: Sometimes true    Ran Out of Food in the Last Year: Not on file   Transportation Needs: No Transportation Needs (10/20/2022)   PRAPARE - Hydrologist (Medical): No    Lack of Transportation (Non-Medical): No  Physical Activity: Not on file  Stress: Not on file  Social Connections: Not on file    Current Medications:  Current Outpatient Medications:    ibuprofen (ADVIL) 800 MG tablet, Take 1 tablet (800 mg total) by mouth every 8 (eight) hours as needed for moderate pain., Disp: 30 tablet, Rfl: 2   loratadine (CLARITIN) 10 MG tablet, Take 10 mg by mouth daily as needed for allergies or rhinitis., Disp: , Rfl:    ondansetron (ZOFRAN) 4 MG tablet, Take 1 tablet (4 mg total) by mouth every 8 (eight) hours as needed for nausea or vomiting., Disp: 30 tablet, Rfl: 1   ondansetron (ZOFRAN-ODT) 4 MG disintegrating tablet, Take 4 mg by mouth every 8 (eight) hours as needed for nausea or vomiting (DISSOLVE ORALLY)., Disp: , Rfl:    oxyCODONE (OXY IR/ROXICODONE) 5 MG immediate release tablet, Take 1 tablet (5 mg total) by mouth every 4 (four) hours as needed for severe pain. For AFTER surgery only., Only take for severe pain, Do not take and drive, do not take with other pain medications, Disp: 30 tablet, Rfl: 0   polyethylene glycol (MIRALAX) 17 g packet, Take 17 g by mouth daily. Can increase to twice daily if needed, Disp: 14 each, Rfl: 3   senna-docusate (SENOKOT-S) 8.6-50 MG tablet, Take 2 tablets by mouth at bedtime. For AFTER surgery, do not take if having diarrhea, Disp: 30 tablet, Rfl: 1   sertraline (ZOLOFT) 25 MG tablet, Take 25 mg by mouth daily., Disp: , Rfl:    simethicone (MYLICON) 80 MG chewable tablet, Chew 1 tablet (80 mg total) by mouth 4 (four) times daily., Disp: 30 tablet, Rfl: 1   triamcinolone cream (KENALOG) 0.1 %, Apply 1 Application topically 2 (two) times daily. (Patient taking differently: Apply 1 Application topically See admin instructions. Apply to affected area(s) 2 times a day), Disp: 30 g, Rfl: 0  Review of  Symptoms: Pertinent positives as per HPI.  Physical Exam: Deferred given limitations of phone visit.  Laboratory & Radiologic Studies: A. UTERUS, CERVIX, BILATERAL FALLOPIAN TUBES, HYSTERECTOMY AND  SALPINGECTOMY:  - Uterine leiomyomas, multiple.  - Largest: 30 cm maximally with a weight greater than 6000 g.  Infarct  type necrosis is extensive.  Malignancy is not identified.  - Smaller leiomyomas involve the submucosa, the myometrium, and the  subserosa.  No malignancy.  - Benign serosa.  - Benign endometrium.  No hyperplasia/EIN or malignancy identified.  - Unremarkable cervix.  No atypia, dysplasia or malignancy.  - Bilateral fallopian tubes: Unremarkable fimbriated fallopian tubes.   Assessment & Plan: Megan [name] is a 37 y.o. woman status post robotic assisted total hysterectomy with bilateral salpingectomy in the setting of large fibroid  uterus.  She is overall doing well postoperatively with good pain control.  She has been started on Suboxone.  She came in today for Foley catheter removal.  She has voided since leaving the visit and feels like she is emptying her bladder.  I suspect her urinary symptoms are related to having the catheter in for multiple days.  Urine was sent for urinalysis and culture but it sounds like this urine was collected from the hat right after her bladder was backfilled.  Patient will come in tomorrow for a clean-catch.  She also has some vaginal itching.  Prescription for Diflucan sent to her pharmacy.  Reviewed pathology from surgery.  She is very happy with this news.  I discussed the assessment and treatment plan with the patient. The patient was provided with an opportunity to ask questions and all were answered. The patient agreed with the plan and demonstrated an understanding of the instructions.   The patient was advised to call back or see an in-person evaluation if the symptoms worsen or if the condition fails to improve as anticipated.    8 minutes of total time was spent for this patient encounter, including preparation, phone counseling with the patient and coordination of care, and documentation of the encounter.   Jeral Pinch, MD  Division of Gynecologic Oncology  Department of Obstetrics and Gynecology  [facility]

## 2022-10-29 ENCOUNTER — Telehealth: Payer: Self-pay | Admitting: Oncology

## 2022-10-29 ENCOUNTER — Inpatient Hospital Stay: Payer: Medicaid Other

## 2022-10-29 NOTE — Telephone Encounter (Signed)
Left a message regarding lab appointment this morning.  Requested a return call.

## 2022-10-29 NOTE — Telephone Encounter (Signed)
Called Megan [name] regarding Megan [name]'s apt for labs today.  She said Megan [name] slept in and she was not able to bring her this morning.  She is going to try to have another family member bring her today or possibly reschedule for tomorrow.  She will [place] call us back.

## 2022-10-29 NOTE — Telephone Encounter (Signed)
Left another message for Megan [name] to see how she is feeling and when lab can be rescheduled.

## 2022-10-30 DIAGNOSIS — Z6833 Body mass index (BMI) 33.0-33.9, adult: Secondary | ICD-10-CM | POA: Diagnosis not present

## 2022-10-30 DIAGNOSIS — D259 Leiomyoma of uterus, unspecified: Secondary | ICD-10-CM | POA: Diagnosis not present

## 2022-10-30 DIAGNOSIS — Z419 Encounter for procedure for purposes other than remedying health state, unspecified: Secondary | ICD-10-CM | POA: Diagnosis not present

## 2022-10-30 DIAGNOSIS — Z79899 Other long term (current) drug therapy: Secondary | ICD-10-CM | POA: Diagnosis not present

## 2022-10-30 DIAGNOSIS — F419 Anxiety disorder, unspecified: Secondary | ICD-10-CM | POA: Diagnosis not present

## 2022-10-31 LAB — URINE CULTURE: Culture: 60000 — AB

## 2022-11-02 DIAGNOSIS — Z79899 Other long term (current) drug therapy: Secondary | ICD-10-CM | POA: Diagnosis not present

## 2022-11-03 ENCOUNTER — Telehealth: Payer: Self-pay | Admitting: Oncology

## 2022-11-03 DIAGNOSIS — Z79899 Other long term (current) drug therapy: Secondary | ICD-10-CM | POA: Diagnosis not present

## 2022-11-03 NOTE — Telephone Encounter (Signed)
Called and left a message asking if she is having any UTI symptoms and to call the office back.

## 2022-11-10 ENCOUNTER — Telehealth: Payer: Self-pay | Admitting: *Deleted

## 2022-11-10 NOTE — Telephone Encounter (Signed)
Per Dr Berline Lopes moved appt from 3/22 to 3/15

## 2022-11-13 ENCOUNTER — Inpatient Hospital Stay: Payer: Medicaid Other | Attending: Gynecologic Oncology | Admitting: Gynecologic Oncology

## 2022-11-13 ENCOUNTER — Encounter: Payer: Self-pay | Admitting: Gynecologic Oncology

## 2022-11-13 VITALS — BP 122/72 | HR 80 | Temp 98.7°F | Resp 16 | Wt 250.0 lb

## 2022-11-13 DIAGNOSIS — Z90710 Acquired absence of both cervix and uterus: Secondary | ICD-10-CM

## 2022-11-13 DIAGNOSIS — D219 Benign neoplasm of connective and other soft tissue, unspecified: Secondary | ICD-10-CM

## 2022-11-13 DIAGNOSIS — Z90722 Acquired absence of ovaries, bilateral: Secondary | ICD-10-CM

## 2022-11-13 DIAGNOSIS — B379 Candidiasis, unspecified: Secondary | ICD-10-CM

## 2022-11-13 MED ORDER — FLUCONAZOLE 150 MG PO TABS: 150.0000 mg | ORAL_TABLET | Freq: Every day | ORAL | 0 refills | Status: AC

## 2022-11-13 NOTE — Patient Instructions (Signed)
It was good to see you today!  You are healing very well from surgery.  Please remember, no heavy lifting for at least 6 weeks and nothing in the vagina for at least 10 weeks.  I sent a prescription for Diflucan to your pharmacy.  Please let me know if your discharge does not improve.

## 2022-11-13 NOTE — Progress Notes (Signed)
Gynecologic Oncology Return Clinic Visit  11/13/22  Reason for Visit: post-op  Treatment History: 10/21/22: Robotic-assisted laparoscopic total hysterectomy with bilateral salpingectomy, laparotomy for specimen removal, cystoscopy     Interval History: Patient reports doing well.  She denies any abdominal pain, has some back pain.  Denies any vaginal bleeding.  Began having vaginal discharge today, describes as white and thicker.  She reports baseline bowel bladder function.  Past Medical/Surgical History: Past Medical History:  Diagnosis Date   Anxiety    Arthritis    Depression    Drug dependence ([place])    Tooth infection 01/28/2022    Past Surgical History:  Procedure Laterality Date   ENDOMETRIAL BIOPSY  03/09/2022   IR 3D INDEPENDENT WKST  10/20/2022   IR ANGIOGRAM PELVIS SELECTIVE OR SUPRASELECTIVE  10/20/2022   IR ANGIOGRAM SELECTIVE EACH ADDITIONAL VESSEL  10/20/2022   IR ANGIOGRAM SELECTIVE EACH ADDITIONAL VESSEL  10/20/2022   IR ANGIOGRAM SELECTIVE EACH ADDITIONAL VESSEL  10/20/2022   IR EMBO TUMOR ORGAN ISCHEMIA INFARCT INC GUIDE ROADMAPPING  10/20/2022   IR RADIOLOGIST EVAL & MGMT  10/06/2022   IR US GUIDE VASC ACCESS LEFT  10/20/2022   LIPOMA EXCISION  2009   on forehead   ROBOTIC ASSISTED TOTAL HYSTERECTOMY WITH BILATERAL SALPINGO OOPHERECTOMY Bilateral 10/21/2022   Procedure: DIAGNOSTIC LAPAROSCOPY, XI ROBOTIC ASSISTED HYSTERECTOMY WITH BILATERAL SALPINGECTOMY, EXPLORATORY LAPAROTOMY FOR SPECIMEN DELIVERY, CYSTOSCOPY;  Surgeon: [name], MD;  Location: [facility];  Service: Gynecology;  Laterality: Bilateral;    Family History  Problem Relation Age of Onset   Bone cancer Maternal Aunt    Colon cancer Neg Hx    Breast cancer Neg Hx    Ovarian cancer Neg Hx    Endometrial cancer Neg Hx    Pancreatic cancer Neg Hx    Prostate cancer Neg Hx     Social History   Socioeconomic History   Marital status: Single    Spouse name: Not on file   Number of children:  Not on file   Years of education: Not on file   Highest education level: Not on file  Occupational History   Occupation: on leave from work  Tobacco Use   Smoking status: Every Day    Packs/day: 0.50    Years: 18.00    Additional pack years: 0.00    Total pack years: 9.00    Types: Cigarettes   Smokeless tobacco: Never  Vaping Use   Vaping Use: Never used  Substance and Sexual Activity   Alcohol use: Not Currently    Alcohol/week: 4.0 standard drinks of alcohol    Types: 4 Cans of beer per week    Comment: weekly   Drug use: Yes    Types: Other-see comments    Comment: CBD eatables   Sexual activity: Not Currently  Other Topics Concern   Not on file  Social History Narrative   Not on file   Social Determinants of Health   Financial Resource Strain: High Risk (10/13/2022)   Overall Financial Resource Strain (CARDIA)    Difficulty of Paying Living Expenses: Very hard  Food Insecurity: No Food Insecurity (10/20/2022)   Hunger Vital Sign    Worried About Running Out of Food in the Last Year: Never true    Ran Out of Food in the Last Year: Never true  Recent Concern: Food Insecurity - Food Insecurity Present (10/13/2022)   Hunger Vital Sign    Worried About Running Out of Food in the Last Year:  Sometimes true    Ran Out of Food in the Last Year: Not on file  Transportation Needs: No Transportation Needs (10/20/2022)   PRAPARE - Hydrologist (Medical): No    Lack of Transportation (Non-Medical): No  Physical Activity: Not on file  Stress: Not on file  Social Connections: Not on file    Current Medications:  Current Outpatient Medications:    fluconazole (DIFLUCAN) 150 MG tablet, Take 1 tablet (150 mg total) by mouth daily., Disp: 1 tablet, Rfl: 0   ondansetron (ZOFRAN) 4 MG tablet, Take 1 tablet (4 mg total) by mouth every 8 (eight) hours as needed for nausea or vomiting., Disp: 30 tablet, Rfl: 1   polyethylene glycol (MIRALAX) 17 g packet,  Take 17 g by mouth daily. Can increase to twice daily if needed, Disp: 14 each, Rfl: 3   senna-docusate (SENOKOT-S) 8.6-50 MG tablet, Take 2 tablets by mouth at bedtime. For AFTER surgery, do not take if having diarrhea, Disp: 30 tablet, Rfl: 1   sertraline (ZOLOFT) 25 MG tablet, Take 25 mg by mouth daily., Disp: , Rfl:    simethicone (MYLICON) 80 MG chewable tablet, Chew 1 tablet (80 mg total) by mouth 4 (four) times daily., Disp: 30 tablet, Rfl: 1   SUBOXONE 8-2 MG FILM, Place under the tongue., Disp: , Rfl:    triamcinolone cream (KENALOG) 0.1 %, Apply 1 Application topically 2 (two) times daily. (Patient taking differently: Apply 1 Application topically See admin instructions. Apply to affected area(s) 2 times a day), Disp: 30 g, Rfl: 0   ibuprofen (ADVIL) 800 MG tablet, Take 1 tablet (800 mg total) by mouth every 8 (eight) hours as needed for moderate pain. (Patient not taking: Reported on 11/10/2022), Disp: 30 tablet, Rfl: 2  Review of Systems: + Leg swelling, vaginal discharge, back pain, headache. Denies appetite changes, fevers, chills, fatigue, unexplained weight changes. Denies hearing loss, neck lumps or masses, mouth sores, ringing in ears or voice changes. Denies cough or wheezing.  Denies shortness of breath. Denies chest pain or palpitations.  Denies abdominal distention, pain, blood in stools, constipation, diarrhea, nausea, vomiting, or early satiety. Denies pain with intercourse, dysuria, frequency, hematuria or incontinence. Denies hot flashes, pelvic pain, vaginal bleeding.   Denies joint pain or muscle pain/cramps. Denies itching, rash, or wounds. Denies dizziness, numbness or seizures. Denies swollen lymph nodes or glands, denies easy bruising or bleeding. Denies anxiety, depression, confusion, or decreased concentration.  Physical Exam: BP 122/72 (BP Location: Left Arm, Patient Position: Sitting)   Pulse 80   Temp 98.7 F (37.1 C) (Oral)   Resp 16   Wt 250 lb  (113.4 kg)   SpO2 100% Comment: RA  BMI 39.16 kg/m  General: Alert, oriented, no acute distress. HEENT: Normocephalic, atraumatic, sclera anicteric. Chest: Unlabored breathing on room air. Abdomen: Obese, soft, nontender.  Normoactive bowel sounds.  No masses or hepatosplenomegaly appreciated.  Well-healed incisions. Extremities: Grossly normal range of motion.  Warm, well perfused.  No edema bilaterally. GU: Normal appearing external genitalia without erythema, excoriation, or lesions.  Speculum exam reveals some thicker white discharge, no bleeding.  Cuff intact, suture visible.  Bimanual exam reveals intact, no fluctuance or tenderness with palpation.  .  Laboratory & Radiologic Studies: A. UTERUS, CERVIX, BILATERAL FALLOPIAN TUBES, HYSTERECTOMY AND  SALPINGECTOMY:  - Uterine leiomyomas, multiple.  - Largest: 30 cm maximally with a weight greater than 6000 g.  Infarct  type necrosis is extensive.  Malignancy is not  identified.  - Smaller leiomyomas involve the submucosa, the myometrium, and the  subserosa.  No malignancy.  - Benign serosa.  - Benign endometrium.  No hyperplasia/EIN or malignancy identified.  - Unremarkable cervix.  No atypia, dysplasia or malignancy.  - Bilateral fallopian tubes: Unremarkable fimbriated fallopian tubes.   Assessment & Plan: Megan [name] is a 37 y.o. woman status post robotic hysterectomy with bilateral salpingectomy for fibroid uterus.  The patient is doing very well from a postoperative standpoint.  Discussed continued expectations and restrictions.  Prescription sent in for Diflucan given suspected yeast infection.  Reviewed pathology from surgery.  Also reviewed photo from surgery (patient was given a copy of both).  Patient is discharged back to her primary care provider and OB/GYN.  18 minutes of total time was spent for this patient encounter, including preparation, face-to-face counseling with the patient and coordination of care, and  documentation of the encounter.  Jeral Pinch, MD  Division of Gynecologic Oncology  Department of Obstetrics and Gynecology  [facility]

## 2022-11-17 ENCOUNTER — Ambulatory Visit: Payer: BLUE CROSS/BLUE SHIELD | Admitting: Family Medicine

## 2022-11-17 ENCOUNTER — Telehealth: Payer: Self-pay | Admitting: Family Medicine

## 2022-11-17 NOTE — Telephone Encounter (Signed)
3.19.24 no show letter sent

## 2022-11-19 NOTE — Telephone Encounter (Signed)
1st no show, fee waived, letter sent to reschedule/notify of policy 

## 2022-11-20 ENCOUNTER — Encounter: Payer: Medicaid Other | Admitting: Gynecologic Oncology

## 2022-11-30 DIAGNOSIS — D259 Leiomyoma of uterus, unspecified: Secondary | ICD-10-CM | POA: Diagnosis not present

## 2022-11-30 DIAGNOSIS — F419 Anxiety disorder, unspecified: Secondary | ICD-10-CM | POA: Diagnosis not present

## 2022-11-30 DIAGNOSIS — Z79899 Other long term (current) drug therapy: Secondary | ICD-10-CM | POA: Diagnosis not present

## 2022-11-30 DIAGNOSIS — Z6841 Body Mass Index (BMI) 40.0 and over, adult: Secondary | ICD-10-CM | POA: Diagnosis not present

## 2022-11-30 DIAGNOSIS — Z419 Encounter for procedure for purposes other than remedying health state, unspecified: Secondary | ICD-10-CM | POA: Diagnosis not present

## 2022-12-02 DIAGNOSIS — Z79899 Other long term (current) drug therapy: Secondary | ICD-10-CM | POA: Diagnosis not present

## 2022-12-12 DIAGNOSIS — H5213 Myopia, bilateral: Secondary | ICD-10-CM | POA: Diagnosis not present

## 2022-12-22 ENCOUNTER — Ambulatory Visit: Payer: BLUE CROSS/BLUE SHIELD | Admitting: Family Medicine

## 2022-12-25 DIAGNOSIS — Z6841 Body Mass Index (BMI) 40.0 and over, adult: Secondary | ICD-10-CM | POA: Diagnosis not present

## 2022-12-25 DIAGNOSIS — D259 Leiomyoma of uterus, unspecified: Secondary | ICD-10-CM | POA: Diagnosis not present

## 2022-12-25 DIAGNOSIS — Z79899 Other long term (current) drug therapy: Secondary | ICD-10-CM | POA: Diagnosis not present

## 2022-12-25 DIAGNOSIS — F419 Anxiety disorder, unspecified: Secondary | ICD-10-CM | POA: Diagnosis not present

## 2022-12-25 DIAGNOSIS — F17210 Nicotine dependence, cigarettes, uncomplicated: Secondary | ICD-10-CM | POA: Diagnosis not present

## 2022-12-30 DIAGNOSIS — Z419 Encounter for procedure for purposes other than remedying health state, unspecified: Secondary | ICD-10-CM | POA: Diagnosis not present

## 2023-02-09 ENCOUNTER — Telehealth: Payer: Self-pay | Admitting: *Deleted

## 2023-02-09 NOTE — Telephone Encounter (Signed)
Spoke with Ms. Hoopes after she left a voicemail for the office.  Patient states at the 11th. Week post hysterectomy she had sexual intercourse and it was uncomfortable and she had some light red bleeding the next day that only lasted several hours then stopped. Pt also states, "then two weeks later I placed boric acid pills in my vagina for a yeast infection three days in a row" after the third day patient had some vaginal spotting but now it's gone.   Patient denies vaginal pain, vaginal discharge, vaginal bleeding, fever and or chills. Per Dr. Winferd Humphrey last progress note and follow up with patient she sent in a Rx. For Diflucan and patient was discharged back to her primary care and ob/gyn.  Patient states that Dr. Pricilla Holm said it was ok to use the boric acid pills instead of the diflucan. Pt was advised not to use the boric acid pills in vagina at this time. Patient also states that she hasn't made an appt. With an ob/gyn as of yet. Patient also advised to make an appt. With an Ob/gyn and follow up with her PCP.   Patient states she is having some "hot flashes" and is this normal?  Pt was reassured that this is normal and she maybe having some perimenopause symptoms since she still has her ovaries.  Pt instructed to call the office if she has any other concerns or questions while waiting to follow up with OB/Gyn.

## 2023-02-11 DIAGNOSIS — Z7689 Persons encountering health services in other specified circumstances: Secondary | ICD-10-CM | POA: Diagnosis not present

## 2023-02-11 NOTE — Telephone Encounter (Signed)
Called patient to see if she would like to come in for appt 6/28 at 8am. LVM.

## 2023-04-01 HISTORY — PX: LIPOMA EXCISION: SHX5283

## 2023-07-02 HISTORY — PX: MOUTH SURGERY: SHX715

## 2023-09-09 ENCOUNTER — Telehealth: Payer: Self-pay | Admitting: Family Medicine

## 2023-09-09 ENCOUNTER — Ambulatory Visit: Payer: No Typology Code available for payment source | Admitting: Family Medicine

## 2023-09-09 NOTE — Telephone Encounter (Signed)
 Copied from CRM [id number]. Topic: General - Other >> Sep 09, 2023 12:00 PM Gurney Maxin H wrote: Reason for CRM: Patient rescheduled appointment for today transportation was cancelled, new appointment 10/12/2023

## 2023-09-09 NOTE — Telephone Encounter (Signed)
 Pt cancelled today 09/09/23 OV with Dr Janee Morn. This was in the note section-Lack of Transportation (Transportation was cancelled no ride)

## 2023-09-13 NOTE — Telephone Encounter (Signed)
 [employer] ins/cancelled her ride & she had no transportation, not counted as no show

## 2023-09-21 ENCOUNTER — Encounter (HOSPITAL_COMMUNITY): Payer: Self-pay

## 2023-09-21 ENCOUNTER — Emergency Department (HOSPITAL_COMMUNITY)
Admission: EM | Admit: 2023-09-21 | Discharge: 2023-09-21 | Disposition: A | Discharge status: Home / Self Care | Payer: No Typology Code available for payment source

## 2023-09-21 ENCOUNTER — Other Ambulatory Visit: Payer: Self-pay

## 2023-09-21 DIAGNOSIS — R197 Diarrhea, unspecified: Secondary | ICD-10-CM | POA: Diagnosis not present

## 2023-09-21 DIAGNOSIS — R110 Nausea: Secondary | ICD-10-CM | POA: Insufficient documentation

## 2023-09-21 DIAGNOSIS — Z760 Encounter for issue of repeat prescription: Secondary | ICD-10-CM | POA: Insufficient documentation

## 2023-09-21 DIAGNOSIS — R61 Generalized hyperhidrosis: Secondary | ICD-10-CM | POA: Insufficient documentation

## 2023-09-21 MED ORDER — BUPRENORPHINE HCL-NALOXONE HCL 8-2 MG SL FILM: 1.0000 | ORAL_FILM | Freq: Every day | SUBLINGUAL | 0 refills | Status: AC

## 2023-09-21 NOTE — ED Provider Notes (Cosign Needed Addendum)
[name] EMERGENCY DEPARTMENT AT [facility] Provider Note   CSN: [id number] Arrival date & time: 09/21/23  1608     History  Chief Complaint  Patient presents with   Medication Refill    Megan [name] is a 38 y.o. female.   Medication Refill Pt presents to the ED today wanting suboxone refill. Pt was placed on suboxone due to being on oxycodone  post hysterectomy. Was unable have appointment yesterday due to insurance changes that required a copay. States she ran out Sunday night. Endorses cold sweats, hot flashes, and diarrhea. Taking Zofran with relief from nausea.   Has a letter saying that she is not under pain contract  and is opioid dependent taking buprenorphine and suboxone signed by Tama Headings, PA-C from [facility] medical pain management. She was told to come to the ED by the pain clinic if insurance could not be addressed.   She takes 8 mg of buprenorphine and 2 mg naloxone.    Home Medications Prior to Admission medications   Medication Sig Start Date End Date Taking? Authorizing Provider  Buprenorphine HCl-Naloxone HCl 8-2 MG FILM Place 1 Film under the tongue daily for 7 days. 09/21/23 09/28/23 Yes Young, Harmon Dun, DO  fluconazole (DIFLUCAN) 150 MG tablet Take 1 tablet (150 mg total) by mouth daily. 11/13/22   Carver Fila, MD  ibuprofen (ADVIL) 800 MG tablet Take 1 tablet (800 mg total) by mouth every 8 (eight) hours as needed for moderate pain. Patient not taking: Reported on 11/10/2022 10/23/22   Warner Mccreedy D, NP  ondansetron (ZOFRAN) 4 MG tablet Take 1 tablet (4 mg total) by mouth every 8 (eight) hours as needed for nausea or vomiting. 07/29/22   Garnette Gunner, MD  polyethylene glycol (MIRALAX) 17 g packet Take 17 g by mouth daily. Can increase to twice daily if needed 10/23/22   Cross, Efraim Kaufmann D, NP  senna-docusate (SENOKOT-S) 8.6-50 MG tablet Take 2 tablets by mouth at bedtime. For AFTER surgery, do not take if having diarrhea 10/09/22    Warner Mccreedy D, NP  sertraline (ZOLOFT) 25 MG tablet Take 25 mg by mouth daily. 10/06/22   [provider]  simethicone (MYLICON) 80 MG chewable tablet Chew 1 tablet (80 mg total) by mouth 4 (four) times daily. 10/23/22   Cross, Efraim Kaufmann D, NP  SUBOXONE 8-2 MG FILM Place under the tongue. 11/02/22   [provider]  triamcinolone cream (KENALOG) 0.1 % Apply 1 Application topically 2 (two) times daily. Patient taking differently: Apply 1 Application topically See admin instructions. Apply to affected area(s) 2 times a day 07/29/22   Garnette Gunner, MD      Allergies    Penicillins, Tramadol, and Penicillin g    Review of Systems   Review of Systems  Constitutional:        Hot flashes  Gastrointestinal:  Positive for diarrhea.  All other systems reviewed and are negative.   Physical Exam Updated Vital Signs BP (!) 150/90 (BP Location: Left Arm)   Pulse 96   Temp 99.6 F (37.6 C) (Oral)   Resp 16   Ht 5\' 7"  (1.702 m)   Wt 113 kg   SpO2 100%   BMI 39.02 kg/m  Physical Exam Vitals and nursing note reviewed.  Constitutional:      General: She is not in acute distress.    Appearance: Normal appearance.  HENT:     Head: Normocephalic and atraumatic.  Eyes:  Extraocular Movements: Extraocular movements intact.     Conjunctiva/sclera: Conjunctivae normal.  Cardiovascular:     Rate and Rhythm: Normal rate and regular rhythm.     Pulses: Normal pulses.     Heart sounds: Normal heart sounds. No murmur heard.    No friction rub. No gallop.  Pulmonary:     Effort: Pulmonary effort is normal. No respiratory distress.     Breath sounds: Normal breath sounds.  Abdominal:     General: Abdomen is flat.     Palpations: Abdomen is soft.     Tenderness: There is no abdominal tenderness.  Skin:    General: Skin is warm and dry.  Neurological:     General: No focal deficit present.     Mental Status: She is alert. Mental status is at baseline.  Psychiatric:         Mood and Affect: Mood normal.     ED Results / Procedures / Treatments   Labs (all labs ordered are listed, but only abnormal results are displayed) Labs Reviewed - No data to display  EKG None  Radiology No results found.  Procedures Procedures    Medications Ordered in ED Medications - No data to display  ED Course/ Medical Decision Making/ A&P                                 Medical Decision Making Risk Prescription drug management.   This patient is a 38 year old female who presents to the ED for concern of needing Suboxone refilled..   Differential diagnoses prior to evaluation: Drug-seeking, opioid withdrawal  Past Medical History / Social History / Additional history: Chart reviewed. Pertinent results include: Patient is currently being seen by [facility] medical.  They are currently managing her pain management.  Patient also presented with a letter from the [facility] medical signed by Wyvonnia Dusky which showed that she is not had any pain contract.  Medications / Treatment: Patient was provided with a 1 week supply of Suboxone.  ED course: Patient is a 38 year old female presents ED complaining of needing Suboxone refilled after running into insurance issues with her pain management.  Patient stated that pain management told her to come here if not able to resolve her insurance issues before having withdrawal symptoms.  Today she has been having hot flashes, nausea which has been treating at home with Zofran, diarrhea.  Patient also presented with a letter stating that she is not under any pain contract at this time.  Patient did not appear to be drug-seeking and seem to actually be experiencing withdrawal symptoms.  Had been diagnosed with opioid dependence after hysterectomy.  Patient was then prescribed a weeks worth of Suboxone and told to follow-up with PCP and/or pain management for further refills.  Patient expressed agreement understanding of plan.  Patient  vitals remained stable throughout the course of her time here.  I believe this patient is to be discharged.   Disposition: After consideration of the diagnostic results and the patients response to treatment, I feel that patient benefit from discharge and treatment as above.   emergency department workup does not suggest an emergent condition requiring admission or immediate intervention beyond what has been performed at this time. The plan is: Refilled Suboxone for 1 week, follow-up with PCP and pain management, return for new or worsening symptoms. The patient is safe for discharge and has been instructed to return immediately for worsening  symptoms, change in symptoms or any other concerns.   Final Clinical Impression(s) / ED Diagnoses Final diagnoses:  Medication refill    Rx / DC Orders ED Discharge Orders          Ordered    Buprenorphine HCl-Naloxone HCl 8-2 MG FILM  Daily        09/21/23 1958              Lunette Stands, [place] 09/21/23 2002    [address], [place] 09/21/23 2008    Coral Spikes, [place] 09/22/23 [phone]

## 2023-09-21 NOTE — Discharge Instructions (Addendum)
You were seen today for the the refill of your Suboxone.  You are given 1 more week supply. Take this as prescribed.  Be sure to follow-up with your PCP and pain management for further refills. Patient has been sent to the [facility] on [address].

## 2023-09-21 NOTE — ED Triage Notes (Signed)
Pt requesting prescription for suboxone due to unable to get to doctor appt yesterday for refill.  Pt reports last dose on Sunday evening.  C/o hot flashes, chills, diarrhea and nausea. Zofran at home with relief for nausea.

## 2023-09-24 ENCOUNTER — Telehealth: Payer: Self-pay

## 2023-09-24 NOTE — Transitions of Care (Post Inpatient/ED Visit) (Signed)
   09/24/2023  Name: Megan [name] MRN: [id number] DOB: Oct 23, 1985  Today's TOC FU Call Status: Today's TOC FU Call Status:: Unsuccessful Call (1st Attempt) Unsuccessful Call (1st Attempt) Date: 09/24/23  Attempted to reach the patient regarding the most recent Inpatient/ED visit.  Follow Up Plan: Additional outreach attempts will be made to reach the patient to complete the Transitions of Care (Post Inpatient/ED visit) call.   Signature Jodelle Green, RMA

## 2023-09-28 ENCOUNTER — Telehealth: Payer: Self-pay | Admitting: Family Medicine

## 2023-09-28 ENCOUNTER — Inpatient Hospital Stay: Payer: No Typology Code available for payment source | Admitting: Family Medicine

## 2023-09-28 NOTE — Telephone Encounter (Signed)
11/17/2022 no show 09/09/2023 same day cancel/transportation - not counted 09/28/2023 late arrival  Final warning letter sent via mail and mychart

## 2023-09-30 ENCOUNTER — Ambulatory Visit (INDEPENDENT_AMBULATORY_CARE_PROVIDER_SITE_OTHER): Payer: No Typology Code available for payment source | Admitting: Family Medicine

## 2023-09-30 ENCOUNTER — Encounter: Payer: Self-pay | Admitting: Family Medicine

## 2023-09-30 ENCOUNTER — Other Ambulatory Visit (HOSPITAL_COMMUNITY)
Admission: RE | Admit: 2023-09-30 | Discharge: 2023-09-30 | Disposition: A | Discharge status: Home / Self Care | Payer: No Typology Code available for payment source | Source: Ambulatory Visit | Attending: Family Medicine | Admitting: Family Medicine

## 2023-09-30 VITALS — BP 132/84 | HR 88 | Temp 97.6°F | Wt 273.0 lb

## 2023-09-30 DIAGNOSIS — N898 Other specified noninflammatory disorders of vagina: Secondary | ICD-10-CM | POA: Diagnosis present

## 2023-09-30 DIAGNOSIS — G8929 Other chronic pain: Secondary | ICD-10-CM

## 2023-09-30 DIAGNOSIS — F1193 Opioid use, unspecified with withdrawal: Secondary | ICD-10-CM

## 2023-09-30 DIAGNOSIS — R768 Other specified abnormal immunological findings in serum: Secondary | ICD-10-CM

## 2023-09-30 DIAGNOSIS — R102 Pelvic and perineal pain: Secondary | ICD-10-CM | POA: Diagnosis not present

## 2023-09-30 DIAGNOSIS — D219 Benign neoplasm of connective and other soft tissue, unspecified: Secondary | ICD-10-CM | POA: Diagnosis not present

## 2023-09-30 DIAGNOSIS — G894 Chronic pain syndrome: Secondary | ICD-10-CM | POA: Insufficient documentation

## 2023-09-30 DIAGNOSIS — M5136 Other intervertebral disc degeneration, lumbar region with discogenic back pain only: Secondary | ICD-10-CM

## 2023-09-30 MED ORDER — TIZANIDINE HCL 4 MG PO CAPS
4.0000 mg | ORAL_CAPSULE | Freq: Three times a day (TID) | ORAL | 0 refills | Status: AC | PRN
Start: 2023-09-30 — End: 2023-10-30

## 2023-09-30 MED ORDER — METHYLPREDNISOLONE SODIUM SUCC 125 MG IJ SOLR
125.0000 mg | Freq: Once | INTRAMUSCULAR | Status: AC
Start: 2023-09-30 — End: 2023-09-30
  Administered 2023-09-30: 125 mg via INTRAMUSCULAR

## 2023-09-30 NOTE — Patient Instructions (Addendum)
VISIT SUMMARY:  During today's visit, we discussed your ongoing lupus treatment, pain management, and the need for a rheumatology referral. We also addressed your concerns about opioid withdrawal symptoms and preventive care, including the pneumonia vaccine.  YOUR PLAN:  -SYSTEMIC LUPUS ERYTHEMATOSUS (SLE): SLE is an autoimmune disease where the immune system attacks its own tissues, causing inflammation and pain. We administered a methylprednisolone injection to help manage your symptoms and will refer you to a rheumatologist for further evaluation. We will also obtain your previous lab work and imaging for the referral.  -CHRONIC PAIN: Chronic pain can be a long-lasting pain that persists beyond the usual recovery period or accompanies a chronic health condition. Your pain is related to SLE and your post-hysterectomy status. We discussed alternative pain management options and you can follow up with your management specialist. You will refill tizanidine 4 mg three times daily as needed for 30 days.  Will need follow-up if ongoing.  -OPIOID WITHDRAWAL: Opioid withdrawal occurs when you stop or reduce opioid use after dependence has developed, leading to symptoms like anxiety, chills, and hot flashes. We discussed supportive care options to manage these symptoms.  Follow-up with pain management for for further assistance.  If severe, go to the emergency department.  -GENERAL HEALTH MAINTENANCE: We discussed your interest in the pneumonia vaccine and will address this further at your next visit.  INSTRUCTIONS:  Please follow up with pain management and rheumatology as discussed. We will call you with the results of your vaginal swab for BV and yeast. If your symptoms worsen or do not improve, please follow up as needed.

## 2023-09-30 NOTE — Progress Notes (Signed)
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 Reports chronic pain, swelling, and soreness post-activity. Previous prednisone effective but discontinued due to adverse effects. Current symptoms include nausea and rashes.  Discussed trying alternative corticosteroid.  Offered methylprednisolone injection and she is amenable to this as IM x 1. - Administer methylprednisolone IM (125 mg) - Refer to rheumatology - Obtain previous lab work and imaging for referral  Chronic Pain Chronic pain abdominal secondary to fibroids status post-hysterectomy and lower back pain secondary to lumbar disc.  Possible inflammatory arthropathy/SLE component. Using Suboxone but facing insurance issues and withdrawal symptoms. NSAIDs previously ineffective; currently using tizanidine. Discussed alternative pain management including NSAIDs, muscle relaxers, and antidepressant such as TCAs or duloxetine.  Declines alternative treatment.  Prefers to continue with current pain management provider. -Recommend follow-up with current pain management - Prescribe tizanidine 4 mg, three times daily as needed for 1 month - Discuss alternative pain management options including NSAIDs and muscle relaxers - Manage withdrawal symptoms with supportive care (e.g., loperamide for diarrhea, Tylenol and ibuprofen for chills and sweats)  Opioid Withdrawal Experiencing anxiety, chills, and hot flashes likely due to opioid withdrawal. Concerned about managing symptoms and daily activities. Discussed supportive care options. - Provide supportive care for withdrawal symptoms -Follow-up with pain management for further opioid management - Discuss use of loperamide for diarrhea and Tylenol/ibuprofen for chills and sweats  Vaginal Discharge Patient reports her pH is off and mild discharge.  No other vaginal or abdominal symptoms.  She declines testing  for STDs but is amenable for the venous testing. -BV/Candida  General Health Maintenance Inquired about pneumonia vaccine. - Discuss pneumonia vaccine at next visit  Follow-up - Follow up with pain management - Follow up with rheumatology - Call with results of vaginal swab for BV and yeast - Follow up as needed if symptoms worsen or do not improve.         Medications Discontinued During This Encounter  Medication Reason  . triamcinolone cream (KENALOG) 0.1 %   . ibuprofen (ADVIL) 800 MG tablet     No follow-ups on file.    Subjective:   Encounter date: 09/30/2023  Megan [name] is a 38 y.o. female who has Menorrhagia with irregular cycle; Fibroid; Fibroid tumor; BMI 40.0-44.9, adult (HCC); Other physical and mental strain related to work; Eczema; Adjustment disorder with mixed anxiety and depressed mood; and Vaginal discharge on their problem list..   She  has a past medical history of Allergy, Anxiety, Arthritis, Depression, Drug dependence (HCC), GERD (gastroesophageal reflux disease), Neuromuscular disorder (HCC) (6years ago), and Tooth infection (01/28/2022)..   She presents with chief complaint of Hospitalization Follow-up (Would like to be treated for her lupus. Rheumatologist referral. Would like to discuss pneumonia vaccines) .   Discussed the use of AI scribe software for clinical note transcription with the patient, who gave verbal consent to proceed.  History of Present Illness    The patient presents with concerns regarding lupus treatment, pain management, and a rheumatology referral.  The patient has a positive ANA and elevated ESR, with the last rheumatology evaluation in August 2024. They experience persistent pain and soreness, described as post-exercise soreness after daily activities. Current pain management includes Suboxone 8/2 films twice daily, tizanidine 4 mg three times a day, and CBD. Prednisone was previously effective for inflammation but  was discontinued in December due to adverse effects at [facility].  Chronic  pain is also related to a history of hysterectomy and fibroids. Insurance issues have disrupted their pain management care, leading to an emergency department visit when unable to see their provider. They are concerned about opioid withdrawal symptoms, including anxiety, nausea, chills, and hot flashes.   They are interested in a rheumatology referral due to their positive ANA and elevated ESR, with symptoms such as swelling and pain previously managed with prednisone. They report nausea and rashes but no vomiting, diarrhea, or urinary symptoms. A history of arthritis and L4-L5 disc disease contributes to their pain profile.  They inquired about the pneumonia vaccine.  Discussion is deferred due to other acute issues as above      Review of Systems  Genitourinary:  Negative for dysuria, flank pain, frequency, hematuria and urgency.  All other systems reviewed and are negative.   Past Surgical History:  Procedure Laterality Date  . ABDOMINAL HYSTERECTOMY  10/21/2022  . ENDOMETRIAL BIOPSY  03/09/2022  . IR 3D INDEPENDENT WKST  10/20/2022  . IR ANGIOGRAM PELVIS SELECTIVE OR SUPRASELECTIVE  10/20/2022  . IR ANGIOGRAM SELECTIVE EACH ADDITIONAL VESSEL  10/20/2022  . IR ANGIOGRAM SELECTIVE EACH ADDITIONAL VESSEL  10/20/2022  . IR ANGIOGRAM SELECTIVE EACH ADDITIONAL VESSEL  10/20/2022  . IR EMBO TUMOR ORGAN ISCHEMIA INFARCT INC GUIDE ROADMAPPING  10/20/2022  . IR RADIOLOGIST EVAL & MGMT  10/06/2022  . IR US GUIDE VASC ACCESS LEFT  10/20/2022  . LIPOMA EXCISION  2009   on forehead  . ROBOTIC ASSISTED TOTAL HYSTERECTOMY WITH BILATERAL SALPINGO OOPHERECTOMY Bilateral 10/21/2022   Procedure: DIAGNOSTIC LAPAROSCOPY, XI ROBOTIC ASSISTED HYSTERECTOMY WITH BILATERAL SALPINGECTOMY, EXPLORATORY LAPAROTOMY FOR SPECIMEN DELIVERY, CYSTOSCOPY;  Surgeon: Carver Fila, MD;  Location: [facility];  Service: Gynecology;   Laterality: Bilateral;    Outpatient Medications Prior to Visit  Medication Sig Dispense Refill  . fluconazole (DIFLUCAN) 150 MG tablet Take 1 tablet (150 mg total) by mouth daily. 1 tablet 0  . ondansetron (ZOFRAN) 4 MG tablet Take 1 tablet (4 mg total) by mouth every 8 (eight) hours as needed for nausea or vomiting. 30 tablet 1  . polyethylene glycol (MIRALAX) 17 g packet Take 17 g by mouth daily. Can increase to twice daily if needed 14 each 3  . senna-docusate (SENOKOT-S) 8.6-50 MG tablet Take 2 tablets by mouth at bedtime. For AFTER surgery, do not take if having diarrhea 30 tablet 1  . sertraline (ZOLOFT) 25 MG tablet Take 25 mg by mouth daily.    . simethicone (MYLICON) 80 MG chewable tablet Chew 1 tablet (80 mg total) by mouth 4 (four) times daily. 30 tablet 1  . SUBOXONE 8-2 MG FILM Place under the tongue.    . triamcinolone cream (KENALOG) 0.1 % Apply 1 Application topically 2 (two) times daily. (Patient taking differently: Apply 1 Application topically See admin instructions. Apply to affected area(s) 2 times a day) 30 g 0  . ibuprofen (ADVIL) 800 MG tablet Take 1 tablet (800 mg total) by mouth every 8 (eight) hours as needed for moderate pain. (Patient not taking: Reported on 09/30/2023) 30 tablet 2   No facility-administered medications prior to visit.    Family History  Problem Relation Age of Onset  . Arthritis Mother   . Heart disease Mother   . Kidney disease Mother   . Miscarriages / [place] Mother   . Diabetes Maternal Grandfather   . Bone cancer Maternal Aunt   . Early death Sister   . Varicose Veins Maternal Aunt   .  Colon cancer Neg Hx   . Breast cancer Neg Hx   . Ovarian cancer Neg Hx   . Endometrial cancer Neg Hx   . Pancreatic cancer Neg Hx   . Prostate cancer Neg Hx     Social History   Socioeconomic History  . Marital status: Single    Spouse name: Not on file  . Number of children: Not on file  . Years of education: Not on file  . Highest  education level: Not on file  Occupational History  . Occupation: on leave from work  Tobacco Use  . Smoking status: Every Day    Current packs/day: 0.50    Average packs/day: 0.6 packs/day for 24.0 years (15.0 ttl pk-yrs)    Types: Cigarettes  . Smokeless tobacco: Never  . Tobacco comments:    Ready to quit smoking to better my health  Vaping Use  . Vaping status: Never Used  Substance and Sexual Activity  . Alcohol use: Not Currently    Alcohol/week: 4.0 standard drinks of alcohol    Comment: I haven't had a drink since after my surgery and i have lupu  . Drug use: Not Currently    Types: Marijuana    Comment: Vape CBD not Marijuana  . Sexual activity: Not Currently    Birth control/protection: Abstinence, Condom  Other Topics Concern  . Not on file  Social History Narrative  . Not on file   Social Drivers of Health   Financial Resource Strain: High Risk (10/13/2022)   Overall Financial Resource Strain (CARDIA)   . Difficulty of Paying Living Expenses: Very hard  Food Insecurity: Medium Risk (04/20/2023)   Received from [facility]   Hunger Vital Sign   . Worried About Programme researcher, broadcasting/film/video in the Last Year: Sometimes true   . Ran Out of Food in the Last Year: Sometimes true  Transportation Needs: Unmet Transportation Needs (04/20/2023)   Received from [facility]   . In the past 12 months, has lack of reliable transportation kept you from medical appointments, meetings, work or from getting things needed for daily living? : Yes  Physical Activity: Not on file  Stress: Not on file  Social Connections: Not on file  Intimate Partner Violence: Not At Risk (10/20/2022)   Humiliation, Afraid, Rape, and Kick questionnaire   . Fear of Current or Ex-Partner: No   . Emotionally Abused: No   . Physically Abused: No   . Sexually Abused: No                                                                                                  Objective:  Physical  Exam: BP 132/84   Pulse 88   Temp 97.6 F (36.4 C) (Temporal)   Wt 273 lb (123.8 kg)   LMP  (LMP Unknown)   SpO2 97%   BMI 42.76 kg/m     Physical Exam Constitutional:      General: She is not in acute distress.    Appearance: Normal appearance. She is not ill-appearing, toxic-appearing or diaphoretic.  HENT:  Head: Normocephalic and atraumatic.     Nose: Nose normal. No congestion.  Eyes:     General: No scleral icterus.    Extraocular Movements: Extraocular movements intact.  Cardiovascular:     Rate and Rhythm: Normal rate and regular rhythm.     Pulses: Normal pulses.     Heart sounds: Normal heart sounds.  Pulmonary:     Effort: Pulmonary effort is normal. No respiratory distress.     Breath sounds: Normal breath sounds.  Abdominal:     General: Abdomen is flat. Bowel sounds are normal.     Palpations: Abdomen is soft.     Tenderness: There is no abdominal tenderness.  Musculoskeletal:        General: Normal range of motion.     Lumbar back: Spasms and tenderness (Bilateral) present.  Lymphadenopathy:     Cervical: No cervical adenopathy.  Skin:    General: Skin is warm and dry.     Findings: No rash.  Neurological:     General: No focal deficit present.     Mental Status: She is alert and oriented to person, place, and time. Mental status is at baseline.     Motor: No weakness or tremor.     Coordination: Coordination is intact.  Psychiatric:        Attention and Perception: Attention normal.        Mood and Affect: Affect is not inappropriate.        Speech: Speech normal.        Behavior: Behavior is cooperative.        Thought Content: Thought content normal.        Judgment: Judgment is not impulsive.      C-Reactive Protein (CRP) Specimen: Blood - Venous structure (body structure) Component Ref Range & Units 8 mo ago  C-Reactive Protein (CRP) <5.0 mg/L <5.0  Resulting Agency AH [place] [facility](CLIA# [id number])   Specimen Collected: 01/26/23 11:46   Performed by: Nada Maclachlan [place] [facility](CLIA# [id number]) Last Resulted: 01/26/23 17:24  Received From: [facility]  Result Received: 02/17/23 10:33   View Encounter    Contains abnormal data CBC with Differential Specimen: Blood - Venous structure (body structure) Component Ref Range & Units 8 mo ago  WBC 4.40 - 11.00 10*3/uL 6.40  RBC 4.10 - 5.10 10*6/uL 5.10  Hemoglobin 12.3 - 15.3 g/dL 29.5  Hematocrit 62.1 - 44.6 % 42.8  Mean Corpuscular Volume (MCV) 80.0 - 96.0 fL 83.9  Mean Corpuscular Hemoglobin (MCH) 27.5 - 33.2 pg 28.3  Mean Corpuscular Hemoglobin Conc (MCHC) 33.0 - 37.0 g/dL 30.8  Red Cell Distribution Width (RDW) 12.3 - 17.0 % 17.9 High   Platelet Count (PLT) 150 - 450 10*3/uL 238  Mean Platelet Volume (MPV) 6.8 - 10.2 fL 9.7  Neutrophils % % 43  Lymphocytes % % 38  Monocytes % % 8  Eosinophils % % 10  Basophils % % 1  nRBC % % 0  Neutrophils Absolute 1.80 - 7.80 10*3/uL 2.80  Lymphocytes # 1.00 - 4.80 10*3/uL 2.50  Monocytes # 0.00 - 0.80 10*3/uL 0.50  Eosinophils # 0.00 - 0.50 10*3/uL 0.60 High   Basophils # 0.00 - 0.20 10*3/uL 0.10  nRBC Absolute <=0.00 10*3/uL 0.00  Resulting Agency AH [place] [facility](CLIA# [id number])  Specimen Collected: 01/26/23 11:46   Performed by: Michel [place] [facility](CLIA# [id number]) Last Resulted: 01/26/23 17:12  Received From: [facility]  Result  Received: 02/17/23 10:33   View Encounter   Recent Data from [facility] Related to CBC with Differential 01/26/23 04/19/23 Component  6.40 6.30 WBC  5.10 4.72 RBC  14.4 14.5 Hemoglobin  42.8 41.9 Hematocrit  83.9 88.7 Mean Corpuscular Volume (MCV)  28.3 30.8 Mean Corpuscular Hemoglobin (MCH)  33.7 34.7 Mean Corpuscular Hemoglobin Conc (MCHC)  17.9 High  14.5 Red Cell Distribution Width (RDW)  238 225 Platelet Count (PLT)  9.7 10.4 High  Mean Platelet Volume (MPV)   43 41 Neutrophils %  38 42 Lymphocytes %  8 8 Monocytes %  10 8 Eosinophils %  1 1 Basophils %  0 0 nRBC %  2.80 2.60 Neutrophils Absolute  2.50 2.60 Lymphocytes #  0.50 0.50 Monocytes #  0.60 High  0.50 Eosinophils #  0.10 0.10 Basophils #  0.00 0.00 nRBC Absolute    Contains abnormal data Urinalysis with Reflex to Microscopic Specimen: Urine - Urine specimen obtained by clean catch procedure (specimen) Component Ref Range & Units 8 mo ago  Color, Urine Yellow Yellow  Clarity, Urine Clear Cloudy Abnormal   Specific Gravity, Urine 1.005 - 1.025 1.021  pH, Urine 5.0 - 8.0 6.0  Protein, Urine Negative mg/dL Negative  Glucose, Urine Negative mg/dL Negative  Ketones, Urine Negative mg/dL Negative  Bilirubin, Urine Negative Negative  Blood, Urine Negative Negative  Nitrite, Urine Negative Negative  Leukocyte Esterase, Urine Negative 250 Abnormal   Urobilinogen, Urine <2.0 mg/dL Normal  WBC, Urine <6 /HPF 6-12 Abnormal   RBC, Urine 0 - 2 /HPF 11-20 Abnormal   Bacteria, Urine None Seen, Rare /HPF Few Abnormal   Squamous Epithelial Cells, Urine 0 - 5 /HPF 21-30 Abnormal   Resulting Agency AH [place] [facility](CLIA# [id number])  Specimen Collected: 01/26/23 11:46   Performed by: Nada Maclachlan [place] [facility](CLIA# [id number]) Last Resulted: 01/26/23 17:04  Received From: [facility]  Result Received: 02/17/23 10:33   View Encounter   Recent Data from [facility] Related to Urinalysis with Reflex to Microscopic 01/26/23 04/19/23 Component  Yellow Yellow Color, Urine  Cloudy Abnormal  Clear Clarity, Urine  1.021 1.021 Specific Gravity, Urine  6.0 6.5 pH, Urine  Negative Negative Protein, Urine  Negative Negative Glucose, Urine  Negative Negative Ketones, Urine  Negative Negative Bilirubin, Urine  Negative Negative Blood, Urine  Negative Negative Nitrite, Urine  250 Abnormal  75 Abnormal  Leukocyte Esterase, Urine  Normal  Normal Urobilinogen, Urine  6-12 Abnormal  0-5 WBC, Urine  11-20 Abnormal  0-2 RBC, Urine  Few Abnormal  Occasional Abnormal  Bacteria, Urine  21-30 Abnormal  11-20 Abnormal  Squamous Epithelial Cells, Urine   Cyclic Citrullinated Peptide (CCP) Antibody, IgG Specimen: Blood - Venous structure (body structure) Component Ref Range & Units 8 mo ago  Cyclic Citrullinated Peptide Ab <5.0 U/mL <5.0  Resulting Agency AH [place] [facility](CLIA# [id number])  Specimen Collected: 01/26/23 11:46   Performed by: Nada Maclachlan [place] [facility](CLIA# [id number]) Last Resulted: 01/26/23 18:16  Received From: [facility]  Result Received: 02/17/23 10:33   View Encounter   Rheumatoid Factor Specimen: Blood - Venous structure (body structure) Component Ref Range & Units 8 mo ago Comments  Rheumatoid Factor <12.5 IU/mL <7.0 A negative result does not exclude rheumatoid arthritis. Approximately 25% of patients with a diagnosed case of rheumatoid arthritis may present with a negative result for RF. This assay is designed to detect specifically IgM-RF. Other immunoglobulin classes of  RF will therefore not be detected. Elevated IgM (other than RF), such as in patients with gammopathies (e.g. Waldenstrom macroglobulinemia) will not interfere with testing.  Resulting Agency AH [place] [facility](CLIA# [id number])   Specimen Collected: 01/26/23 11:46   Performed by: Michel Harrow [facility](CLIA# [id number]) Last Resulted: 01/26/23 18:38  Received From: [facility]  Result Received: 02/17/23 10:33   View Encounter    Contains abnormal data Sedimentation Rate (ESR) Specimen: Blood - Venous structure (body structure) Component Ref Range & Units 8 mo ago  Sedimentation Rate 0 - 30 mm/hr 43 High   Resulting Agency AH [place] [facility](CLIA# [id number])  Specimen Collected: 01/26/23 11:46   Performed by: Nada Maclachlan [place] [facility](CLIA# [id number]) Last Resulted: 01/26/23 17:32  Received From: [facility]  Result Received: 02/17/23 10:33   View Encounter    Contains abnormal data Antinuclear Antibody, Hep-2 Substrate,IgG Specimen: Blood Component Ref Range & Units 8 mo ago Comments  Antinuclear Antibody,Hep-2 Substrate <1:80 (Negative) Positive 1:1280 Abnormal   -------------------ADDITIONAL INFORMATION------------------- Method: Immunofluorescence using HEp-2 cellular substrate.  Ana Titer 1:160   Ana Pattern Speckled   Ana Titer 2 1:1280   ANA PATTERN 2: Nuclear Dots  Test Performed by: [facility] [address] [place], [place], [place] [address] Lab Director: Paul Dykes M.D. Ph.D.; CLIA# [id number]  Resulting Agency [facility]   Specimen Collected: 01/26/23 11:46   Performed by: Nada Maclachlan [facility] Last Resulted: 01/27/23 22:06  Received From: [facility]  Result Received: 02/17/23 10:33   View Encounter    Contains abnormal data Lipid Panel Specimen: Blood - Venous structure (body structure) Component Ref Range & Units 8 mo ago Comments  Cholesterol, Total, Lipid Panel <200 mg/dL 403 NCEP III Guidelines  Total Cholesterol             Risk Classification                         <200 mg/dL                      Desirable   200-239 mg/dL                   Borderline High   >240 mg/dL                      High  Triglycerides, Lipid Panel <150 mg/dL 474 NCEP-ATP III Guidelines   Triglyceride Value           Risk Classification                         <150 mg/dL                   Normal   150-199 mg/dL                Borderline High   200-499 mg/dL                High   >=259 mg/dL                  Very High  HDL Cholesterol - Lipid Panel >=60 mg/dL 34 Low  NCEP III Guidelines   HDLc  Risk Classification                         >=60 mg/dL                  Optimal   >=40 mg/dL                  Desirable   <40 mg/dL                    Low    LDL Cholesterol, Calculated <100 mg/dL 244 High  LDL Cholesterol is calculated using the Martin/Hopkins equation. Source:  JAMA 2013; 310:  2061-68. NCEP-ATP III Guidelines   LDLc                     Risk Classification                         <100 mg/dL                   Optimal   100-129 mg/dL                Desirable   130-159 mg/dL                Borderline High   160-189 mg/dL                High   >010 mg/dL                   Very High  Non-HDL Cholesterol mg/dL 272   Resulting Agency AH [place] [facility](CLIA# [id number])   Specimen Collected: 01/26/23 11:46   Performed by: Nada Maclachlan [place] [facility](CLIA# [id number]) Last Resulted: 01/26/23 17:24  Received From: [facility]  Result Received: 02/17/23 10:33   View Encounter   Complement C4 Level Specimen: Blood - Venous structure (body structure) Component Ref Range & Units 5 mo ago  C4 Complement 19 - 52 mg/dL 45  Resulting Agency AH [place] [facility](CLIA# [id number])  Specimen Collected: 04/19/23 09:47   Performed by: Nada Maclachlan [place] [facility](CLIA# [id number]) Last Resulted: 04/19/23 15:22  Received From: [facility]  Result Received: 05/20/23 09:07   View Encounter    Contains abnormal data Urinalysis with Reflex to Microscopic Specimen: Urine - Urine specimen obtained by clean catch procedure (specimen) Component Ref Range & Units 5 mo ago  Color, Urine Yellow Yellow  Clarity, Urine Clear Clear  Specific Gravity, Urine 1.005 - 1.025 1.021  pH, Urine 5.0 - 8.0 6.5  Protein, Urine Negative mg/dL Negative  Glucose, Urine Negative mg/dL Negative  Ketones, Urine Negative mg/dL Negative  Bilirubin, Urine Negative Negative  Blood, Urine Negative Negative  Nitrite, Urine Negative Negative  Leukocyte Esterase, Urine Negative, 25 75 Abnormal   Urobilinogen, Urine <2.0 mg/dL Normal  WBC, Urine <6 /HPF 0-5  RBC,  Urine 0 - 2 /HPF 0-2  Bacteria, Urine None Seen, Rare /HPF Occasional Abnormal   Squamous Epithelial Cells, Urine 0 - 5 /HPF 11-20 Abnormal   Resulting Agency AH [place] [facility](CLIA# [id number])  Specimen Collected: 04/19/23 09:47   Performed by: Michel Harrow [facility](CLIA# [id number]) Last Resulted: 04/19/23 13:11  Received From: [facility]  Result Received: 05/20/23 09:07   View Encounter   Recent Data from [facility] Related to Urinalysis with  Reflex to Microscopic 01/26/23 04/19/23 Component  Yellow Yellow Color, Urine  Cloudy Abnormal  Clear Clarity, Urine  1.021 1.021 Specific Gravity, Urine  6.0 6.5 pH, Urine  Negative Negative Protein, Urine  Negative Negative Glucose, Urine  Negative Negative Ketones, Urine  Negative Negative Bilirubin, Urine  Negative Negative Blood, Urine  Negative Negative Nitrite, Urine  250 Abnormal  75 Abnormal  Leukocyte Esterase, Urine  Normal Normal Urobilinogen, Urine  6-12 Abnormal  0-5 WBC, Urine  11-20 Abnormal  0-2 RBC, Urine  Few Abnormal  Occasional Abnormal  Bacteria, Urine  21-30 Abnormal  11-20 Abnormal  Squamous Epithelial Cells, Urine   Smith Antibodies, Quantitative Specimen: Blood - Venous structure (body structure) Component Ref Range & Units 5 mo ago Comments  Smith Antibodies IgG <1.0 (Negative) U <0.2  Test Performed by: [facility] [address] [place], [place], [place] [address] Lab Director: Shana Chute A. Beverlyn Roux Ph.D.; CLIA# [id number]  Resulting Agency [facility]   Specimen Collected: 04/19/23 09:47   Performed by: Nada Maclachlan [facility] Last Resulted: 04/20/23 13:26  Received From: [facility]  Result Received: 05/20/23 09:07   View Encounter   Sjogren's Antibodies Anti-SS-B Specimen: Blood - Venous structure (body structure) Component Ref Range & Units 5 mo ago Comments  SS-B/La Antibodies IgG <1.0 (Negative) U <0.2  Test Performed  by: [facility] [address] [place], [place], [place] [address] Lab Director: Shana Chute A. Beverlyn Roux Ph.D.; CLIA# [id number]  Resulting Agency [facility]   Specimen Collected: 04/19/23 09:47   Performed by: Nada Maclachlan [facility] Last Resulted: 04/20/23 13:15  Received From: [facility]  Result Received: 05/20/23 09:07   View Encounter   Sjogren's Antibodies Anti-SS-A Specimen: Blood - Venous structure (body structure) Component Ref Range & Units 5 mo ago Comments  SS-A/Ro Antibodies IgG <1.0 (Negative) U <0.2  Test Performed by: [facility] [address] [place], [place], [place] [address] Lab Director: Shana Chute A. Beverlyn Roux Ph.D.; CLIA# [id number]  Resulting Agency [facility]   Specimen Collected: 04/19/23 09:47   Performed by: Nada Maclachlan [facility] Last Resulted: 04/20/23 13:19  Received From: [facility]  Result Received: 05/20/23 09:07   View Encounter   SCL-70 Antibodies, Quantitative Specimen: Blood - Venous structure (body structure) Component Ref Range & Units 5 mo ago Comments  Scl 70 Antibodies IgG <1.0 (Negative) U <0.2  Test Performed by: [facility] [address] [place], [place], [place] [address] Lab Director: Shana Chute A. Beverlyn Roux Ph.D.; CLIA# [id number]  Resulting Agency [facility]   Specimen Collected: 04/19/23 09:47   Performed by: Nada Maclachlan [facility] Last Resulted: 04/20/23 13:14  Received From: [facility]  Result Received: 05/20/23 09:07   View Encounter   Ribonucleic Protein Antibodies, Quantitative Specimen: Blood - Venous structure (body structure) Component Ref Range & Units 5 mo ago Comments  RNP Antibodies IgG <1.0 (Negative) U 0.2  Test Performed by: [facility] [address] [place], [place], [place] [address] Lab Director: Shana Chute A. Beverlyn Roux Ph.D.; CLIA# [id number]  Resulting Agency [facility]   Specimen  Collected: 04/19/23 09:47   Performed by: Nada Maclachlan [facility] Last Resulted: 04/20/23 13:27  Received From: [facility]  Result Received: 05/20/23 09:07   View Encounter   dsDNA Antibody, IFA with Reflex to Titer Specimen: Blood - Venous structure (body structure) Component Ref  Range & Units 5 mo ago Comments  DSDNA AB, IgG, S 0 - 99 IU/mL <10 Interpretation: Negative (<100) Negative for anti-dsDNA IgG antibodies. Results do not rule out a diagnosis of systemic lupus erythematosus (SLE). Consider testing for anti-dsDNA IgG using Crithidia luciliae by indirect immunofluorescence, if clinically indicated.  -------------------ADDITIONAL INFORMATION------------------- On 09/15/2022, [facility] implemented a new Double-Stranded DNA (dsDNA) Antibodies method. If patient was tested on previous method and is undergoing serial monitoring, rebaselining may be indicated. Rebaselining, or testing the current sample on the previous method, is available at no charge, subject to reagent availability. The rebaseline result will be added to this report. Contact [facility] at [phone] within 7 days of initial report issuance to request this service. For [facility] patients, call [phone].  Test Performed by: [facility] [address] [place], [place], [place] [address] Lab Director: Molli Hazard. Beverlyn Roux Ph.D.; CLIA# [id number]  Resulting Agency [facility]   Specimen Collected: 04/19/23 09:47   Performed by: Nada Maclachlan [facility] Last Resulted: 04/20/23 17:46  Received From: [facility]  Result Received: 05/20/23 09:07   View Encounter    Contains abnormal data Comprehensive Metabolic Panel Specimen: Blood - Venous structure (body structure) Component Ref Range & Units 5 mo ago Comments  Sodium 136 - 145 mmol/L 137   Potassium 3.5 - 5.1 mmol/L 4.4 NO VISIBLE HEMOLYSIS  Chloride 98 - 107 mmol/L 103   CO2 21 - 31 mmol/L 25    Anion Gap 6 - 14 mmol/L 9   Glucose, Random 70 - 99 mg/dL 79   Blood Urea Nitrogen (BUN) 7 - 25 mg/dL 9   Creatinine 7.25 - 3.66 mg/dL 4.40 High    eGFR >34 VQ/QVZ/5.63O7 57 Low  GFR estimated by CKD-EPI equations(NKF 2021).  "Recommend confirmation of Cr-based eGFR by using Cys-based eGFR and other filtration markers (if applicable) in complex cases and clinical decision-making, as needed."  Albumin 3.5 - 5.7 g/dL 4.1   Total Protein 6.4 - 8.9 g/dL 6.9   Bilirubin, Total 0.3 - 1.0 mg/dL 0.5   Alkaline Phosphatase (ALP) 34 - 104 U/L 82   Aspartate Aminotransferase (AST) 13 - 39 U/L 18   Alanine Aminotransferase (ALT) 7 - 52 U/L 12   Calcium 8.6 - 10.3 mg/dL 9.1   BUN/Creatinine Ratio 10.0 - 20.0 7.3 Low  Potential intrarenal damage  Resulting Agency [facility] [place] [facility](CLIA# [id number])   Specimen Collected: 04/19/23 09:47   Performed by: Nada Maclachlan [place] [facility](CLIA# [id number]) Last Resulted: 04/19/23 15:22  Received From: [facility]  Result Received: 05/20/23 09:07   View Encounter   Recent Data from [facility] Related to Comprehensive Metabolic Panel 01/26/23 04/19/23 Component  140 137 Sodium  4.5  4.4  Potassium  105 103 Chloride  29 25 CO2  6 9 Anion Gap  105 High  79 Glucose, Random  11 9 Blood Urea Nitrogen (BUN)  0.91 1.23 High  Creatinine  82  57 Low   eGFR  4.0 4.1 Albumin  7.0 6.9 Total Protein  0.4 0.5 Bilirubin, Total  80 82 Alkaline Phosphatase (ALP)  36 18 Aspartate Aminotransferase (AST)  16 12 Alanine Aminotransferase (ALT)  9.0 9.1 Calcium  --  7.3 Low   BUN/Creatinine Ratio   Complement C3 Level Specimen: Blood - Venous structure (body structure) Component Ref Range & Units 5 mo ago  C3 Complement 87 - 200 mg/dL 601  Resulting Agency  [facility] [place] [facility](CLIA# [id number])  Specimen Collected: 04/19/23 09:47   Performed by: [facility] [place](CLIA#  [id number]) Last Resulted: 04/19/23 15:22  Received From: [facility]  Result Received: 05/20/23 09:07   View Encounter   Anti Centromere Antibody by IFA Specimen: Blood - Venous structure (body structure) Component Ref Range & Units 5 mo ago Comments  Centromere Antibodies IgG <1.0 (Negative) U <0.2  Test Performed by: [facility] [address] [place], [place], [place] [address] Lab Director: Shana Chute A. Beverlyn Roux Ph.D.; CLIA# [id number]  Resulting Agency [facility]   Specimen Collected: 04/19/23 09:47   Performed by: [facility] Last Resulted: 04/20/23 13:15  Received From: [facility]  Result Received: 05/20/23 09:07   View Encounter    No results found.  No results found for this or any previous visit (from the past 2160 hours).      Garner Nash, MD, MS

## 2023-10-01 ENCOUNTER — Encounter: Payer: Self-pay | Admitting: Family Medicine

## 2023-10-01 LAB — CERVICOVAGINAL ANCILLARY ONLY
Bacterial Vaginitis (gardnerella): NEGATIVE
Candida Glabrata: NEGATIVE
Candida Vaginitis: NEGATIVE
Comment: NEGATIVE
Comment: NEGATIVE
Comment: NEGATIVE

## 2023-10-08 ENCOUNTER — Telehealth: Payer: Self-pay

## 2023-10-08 DIAGNOSIS — M5136 Other intervertebral disc degeneration, lumbar region with discogenic back pain only: Secondary | ICD-10-CM

## 2023-10-08 NOTE — Telephone Encounter (Signed)
 Drug change request from [facility] pharmacy for tizanidine  4 mg capsules. Preferred alternative is baclofen, ryanodex, lioresal intrathecal, gablofen. Please advise, see below.

## 2023-10-09 MED ORDER — METHOCARBAMOL 750 MG PO TABS
750.0000 mg | ORAL_TABLET | Freq: Four times a day (QID) | ORAL | 3 refills | Status: AC
Start: 2023-10-09 — End: ?

## 2023-10-09 NOTE — Addendum Note (Signed)
 Addended by: Catheryn Cluck on: 10/09/2023 04:22 PM   Modules accepted: Orders

## 2023-10-12 ENCOUNTER — Ambulatory Visit: Payer: No Typology Code available for payment source | Admitting: Family Medicine

## 2024-01-26 ENCOUNTER — Telehealth: Payer: Self-pay

## 2024-01-26 ENCOUNTER — Encounter: Payer: Self-pay | Admitting: Family Medicine

## 2024-01-26 NOTE — Telephone Encounter (Signed)
 Copied from CRM [id number]. Topic: General - Other >> Jan 26, 2024 12:49 PM Dorisann Garre T wrote: Reason for CRM: patient is needing a letter for lupus diagnose for her to take to the disability department she would like a call back >> Jan 26, 2024  1:59 PM CMA Jannelle Memory S wrote: Called pt in regards to letter requested with Lupus diagnosis. After review of chart and speaking with Dr. Hildy Lowers pt was referred to Rheumatology but never went. Pt states she was diagnosed by another Doctor that is no longer her Doctor. Pt was advised she must get letter from provider that diagnosed her. Many attempts to schedule an appointment with pt , they were unsuccessful. I explained to pt that we do not have actual diagnosis for her. Per Dr. Hildy Lowers she needs to follow up with Rheumatology and that he can not write a letter without proper diagnosis. Patient became very irate and stated she does not want to talk to me and that she is waiting on Dr. Hildy Lowers nurse to call and then she hung up.

## 2024-01-26 NOTE — Telephone Encounter (Signed)
 Pt asked for a copy of her AVS from 1/30/250 OV with Dr. Hildy Lowers. Pt unable to access it via MyChart as she states it is "acting up." AVS printed and placed in the FO for pt to pick up and password reset for pt to access [facility] account. No further concerns at this time.

## 2024-02-21 NOTE — Progress Notes (Signed)
 Office Visit Note  Patient: Megan [name]             Date of Birth: February 03, 1986           MRN: [id number]             PCP: Sebastian Beverley NOVAK, MD Referring: Sebastian Beverley NOVAK, MD Visit Date: 03/06/2024 Occupation: @GUAROCC @  Subjective:  Generalized pain   History of Present Illness: Megan [name] is a 38 y.o. female seen for the evaluation of generalized pain.  According the patient her symptoms started about 10 years ago when she was suffering from the pain from fibroids she also started experiencing generalized pain.  She states she took Tylenol  and anti-inflammatories over-the-counter.  She has suffered from fibroids for a long time and underwent hysterectomy in 2021.  She states after the hysterectomy she still needed narcotics and could not come off narcotics.  She states she had difficulty weaning off the narcotics.  She has been under care of pain management and is currently on Suboxone .  She states she was evaluated by her PCP and had some extensive labs which showed positive ANA.  She was referred to Mr. Lenor who did some extra labs and the labs were negative for rheumatoid arthritis.  She was not advised to come for a follow-up visit.  She continues to have generalized pain and discomfort all over which she describes in all of her muscles in her joints which she describes in thoracic region, lumbar spine, trapezius region, hips, knees and her ankles.  She recently had x-rays of her hip joints and her lumbar spine.  Hip joint x-rays were unremarkable and the lumbar spine x-rays were stable with mild degenerative changes at L4-L5.  She states she notices some puffiness in her lower extremities on her face and her hands.  She has not noticed any joint swelling.  She gives history of fatigue, dry mouth and dry eyes.  There is no history of oral ulcers, nasal ulcers, malar rash, photosensitivity, Raynaud's or lymphadenopathy.  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  She is  taking early childhood education classes.  She walks about 30 minutes daily.  She is right-handed, single, gravida 2, para 0, miscarriages 2.  She does not drink any alcohol.  She smokes half a pack per day for the last 20 years.  There is questionable family history of rheumatoid arthritis in her mother and IBS in her sister.    Activities of Daily Living:  Patient reports morning stiffness for patient does not know, possibly all day.   Patient Reports nocturnal pain.  Difficulty dressing/grooming: Denies Difficulty climbing stairs: Denies Difficulty getting out of chair: Denies Difficulty using hands for taps, buttons, cutlery, and/or writing: Denies  Review of Systems  Constitutional:  Positive for fatigue.  HENT:  Positive for mouth dryness. Negative for mouth sores.   Eyes:  Positive for dryness.  Respiratory:  Negative for difficulty breathing.   Cardiovascular:  Negative for chest pain and palpitations.  Gastrointestinal:  Positive for blood in stool, constipation and diarrhea.  Endocrine: Positive for increased urination.  Genitourinary:  Negative for involuntary urination.  Musculoskeletal:  Positive for joint pain, joint pain, joint swelling, myalgias, morning stiffness, muscle tenderness and myalgias. Negative for gait problem and muscle weakness.  Skin:  Positive for rash and hair loss. Negative for color change and sensitivity to sunlight.       Under her breast  Allergic/Immunologic: Negative for susceptible to infections.  Neurological:  Positive for headaches. Negative for dizziness.  Hematological:  Negative for swollen glands.  Psychiatric/Behavioral:  Positive for sleep disturbance. Negative for depressed mood. The patient is nervous/anxious.     PMFS History:  Patient Active Problem List   Diagnosis Date Noted   Opioid withdrawal (HCC) 09/30/2023   Degeneration of intervertebral disc of lumbar region with discogenic back pain 09/30/2023   Positive ANA  (antinuclear antibody) 09/30/2023   Chronic pelvic pain syndrome in female 09/30/2023   Vaginal discharge 05/14/2022   Eczema 04/13/2022   Adjustment disorder with mixed anxiety and depressed mood 04/13/2022   Other physical and mental strain related to work 03/11/2022   BMI 40.0-44.9, adult (HCC) 03/09/2022   Fibroid 01/19/2022   Fibroid tumor 01/04/2022   Menorrhagia with irregular cycle 01/01/2022    Past Medical History:  Diagnosis Date   Allergy    Have had sense 71years of age or younger   Anxiety    Arthritis    Depression    Drug dependence (HCC)    GERD (gastroesophageal reflux disease)    Lupus (systemic lupus erythematosus) (HCC)    Neuromuscular disorder (HCC) 6years ago   Diagnosis is in myChart   Tooth infection 01/28/2022    Family History  Problem Relation Age of Onset   Arthritis Mother    Heart disease Mother    Kidney disease Mother    Miscarriages / [place] Mother    Early death Sister    Diabetes Maternal Grandfather    Bone cancer Maternal Aunt    Varicose Veins Maternal Aunt    Colon cancer Neg Hx    Breast cancer Neg Hx    Ovarian cancer Neg Hx    Endometrial cancer Neg Hx    Pancreatic cancer Neg Hx    Prostate cancer Neg Hx    Past Surgical History:  Procedure Laterality Date   ABDOMINAL HYSTERECTOMY  10/21/2022   ENDOMETRIAL BIOPSY  03/09/2022   IR 3D INDEPENDENT WKST  10/20/2022   IR ANGIOGRAM PELVIS SELECTIVE OR SUPRASELECTIVE  10/20/2022   IR ANGIOGRAM SELECTIVE EACH ADDITIONAL VESSEL  10/20/2022   IR ANGIOGRAM SELECTIVE EACH ADDITIONAL VESSEL  10/20/2022   IR ANGIOGRAM SELECTIVE EACH ADDITIONAL VESSEL  10/20/2022   IR EMBO TUMOR ORGAN ISCHEMIA INFARCT INC GUIDE ROADMAPPING  10/20/2022   IR RADIOLOGIST EVAL & MGMT  10/06/2022   IR US  GUIDE VASC ACCESS LEFT  10/20/2022   LIPOMA EXCISION  2009   on forehead   LIPOMA EXCISION  04/2023   MOUTH SURGERY  07/2023   ROBOTIC ASSISTED TOTAL HYSTERECTOMY WITH BILATERAL SALPINGO  OOPHERECTOMY Bilateral 10/21/2022   Procedure: DIAGNOSTIC LAPAROSCOPY, XI ROBOTIC ASSISTED HYSTERECTOMY WITH BILATERAL SALPINGECTOMY, EXPLORATORY LAPAROTOMY FOR SPECIMEN DELIVERY, CYSTOSCOPY;  Surgeon: Viktoria Comer SAUNDERS, MD;  Location: [facility];  Service: Gynecology;  Laterality: Bilateral;   Social History   Social History Narrative   Not on file   Immunization History  Administered Date(s) Administered   PFIZER Comirnaty(Gray Top)Covid-19 Tri-Sucrose Vaccine 11/14/2020     Objective: Vital Signs: BP 107/72 (BP Location: Right Arm, Patient Position: Sitting, Cuff Size: Large)   Pulse 83   Resp 14   Ht 5' 8 (1.727 m)   Wt 262 lb (118.8 kg)   LMP  (LMP Unknown)   BMI 39.84 kg/m    Physical Exam Vitals and nursing note reviewed.  Constitutional:      Appearance: She is well-developed.  HENT:     Head: Normocephalic and atraumatic.  Eyes:  Conjunctiva/sclera: Conjunctivae normal.  Cardiovascular:     Rate and Rhythm: Normal rate and regular rhythm.     Heart sounds: Normal heart sounds.  Pulmonary:     Effort: Pulmonary effort is normal.     Breath sounds: Normal breath sounds.  Abdominal:     General: Bowel sounds are normal.     Palpations: Abdomen is soft.  Musculoskeletal:     Cervical back: Normal range of motion.  Lymphadenopathy:     Cervical: No cervical adenopathy.  Skin:    General: Skin is warm and dry.     Capillary Refill: Capillary refill takes less than 2 seconds.  Neurological:     Mental Status: She is alert and oriented to person, place, and time.  Psychiatric:        Behavior: Behavior normal.      Musculoskeletal Exam: Cervical, thoracic and lumbar spine with good range of motion.  Shoulders, elbows, wrist joints, MCPs PIPs and DIPs with good range of motion with no synovitis.  Hip joints and knee joints in good range of motion without any warmth swelling or effusion.  There was no tenderness over ankles or MTPs.  She had generalized  hyperalgesia and positive tender points.  CDAI Exam: CDAI Score: -- Patient Global: --; Provider Global: -- Swollen: --; Tender: -- Joint Exam 03/06/2024   No joint exam has been documented for this visit   There is currently no information documented on the homunculus. Go to the Rheumatology activity and complete the homunculus joint exam.  Investigation: No additional findings.  Imaging: No results found.  Recent Labs: Lab Results  Component Value Date   WBC 12.2 (H) 10/23/2022   HGB 9.8 (L) 10/23/2022   PLT 490 (H) 10/23/2022   NA 136 10/23/2022   K 4.1 10/23/2022   CL 103 10/23/2022   CO2 24 10/23/2022   GLUCOSE 119 (H) 10/23/2022   BUN 8 10/23/2022   CREATININE 0.91 10/23/2022   BILITOT 0.7 10/08/2022   ALKPHOS 85 10/08/2022   AST 22 10/08/2022   ALT 20 10/08/2022   PROT 8.5 (H) 10/08/2022   ALBUMIN 3.2 (L) 10/08/2022   CALCIUM 8.5 (L) 10/23/2022   GFRAA 82 (L) 02/26/2012   March 13, 2023 MRI left shoulder: IMPRESSION:  1. 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 This is consistent with  the findings on the prior soft tissue ultrasound.  2. Minimal tendinosis of the distal aspect of the far anterior  infraspinatus tendon footprint.  3. Normal variant persistent os acromiale with fluid across the  synchondrosis.  4. Mild edema in the region of the supraspinatus musculotendinous  junction.  Electronically Signed    By: Tanda Lyons M.D.    On: 03/21/2023 19:48   April 19, 2023 x-ray lumbar spine: FINDINGS:  There is no evidence of lumbar spine fracture. Alignment is normal.  Narrow intervertebral spaces are noted at L4-5 and L5-S1. Grade 1  anterolisthesis of L4 on L5 is identified. Minimal anterior spurring  L4 noted.  IMPRESSION:  Mild degenerative joint changes of lower lumbar spine advanced  compared to prior exam. Grade 1 anterolisthesis of L4 on L5.    Electronically Signed     By: Craig Farr M.D.    On: 04/19/2023 10:34   April 19, 2023 centromere negative, dsDNA negative, RNP negative, SCL 70 negative, SSA negative, SSB negative, Smith negative, C3-C4 normal, ANA 1: 160 NS, 1: 1280  nuclear dot, RF negative, anti-CCP negative  Speciality Comments: No specialty comments available.  Procedures:  No procedures performed Allergies: Penicillins, Tramadol , and Penicillin g   Assessment / Plan:     Visit Diagnoses: Positive ANA (antinuclear antibody) - 01/26/23: ESR 43, CRP WNL, RF negative, anti-CCP-. 04/19/23: ANA 1: 160 NS, 1: 1280 nuclear dot, centromere negative, dsDNA negative, RNP negative, SCL 70 negative, SSA negative, SSB negative, Smith negative C3 and C4 WNL -patient is concerned that she may have lupus.  I had a detailed discussion with the patient.  The labs show positive ANA but the ENA panel was negative and complements were normal.  She gives history of dry mouth and dry eyes which most likely is related to her medication use.  There is no history of oral ulcers, nasal ulcers, malar rash, Raynaud's phenomenon, inflammatory arthritis or photosensitivity.  I will recheck labs today.  She has generalized hyperalgesia and positive tender points which most likely is associated with fibromyalgia.  Plan: CBC with Differential/Platelet, Comprehensive metabolic panel with GFR, Sedimentation rate, VITAMIN D  25 Hydroxy (Vit-D Deficiency, Fractures), ANA, Anti-scleroderma antibody, RNP Antibody, Anti-Smith antibody, Sjogrens syndrome-A extractable nuclear antibody, Sjogrens syndrome-B extractable nuclear antibody, Anti-DNA antibody, double-stranded, C3 and C4, Beta-2  glycoprotein antibodies, Cardiolipin antibodies, IgG, IgM, IgA, Thyroid  peroxidase antibody, Thyroglobulin antibody  Polyarthralgia-she complains of pain and discomfort in multiple joints including her entire spine, hips, knees and her ankles.  No synovitis was noted.  Chronic pain of both hips -has been  experiencing pain in her hip joints.  She had good range of motion of both hips today.  X-rays of bilateral hip joints April 19, 2023 were unremarkable in epic everywhere  Chronic pain of both knees -she complains of pain and discomfort in her bilateral knee joints.  No warmth swelling or effusion was noted.  Plan: XR KNEE 3 VIEW RIGHT, XR KNEE 3 VIEW LEFT.  X-rays of bilateral knee joints were unremarkable.  Degeneration of intervertebral disc of lumbar region with discogenic back pain -she complains of chronic discomfort in her lower back.  07/08/16 x-ray lumbar spine: Mild L4-5 degenerative disc disease.  Repeat x-rays from April 19, 2023 were reviewed which also showed mild degenerative changes and spondylolisthesis at L4-5.  Myalgia -she complains of pain in all of her muscles and generalized achiness.  She had positive tender points.  Detailed counsel regarding fibromyalgia syndrome was provided.  Need for regular exercise and stretching was discussed.  Benefits of  swimming and water  aerobics were discussed.  Plan: CK  Other fatigue -she continues to have generalized pain and fatigue.  Plan: TSH, Serum protein electrophoresis with reflex  Vitamin D  deficiency -will recheck vitamin D  level today.  Plan: VITAMIN D  25 Hydroxy (Vit-D Deficiency, Fractures)  Eczema, unspecified type - Mostly on face.  Adjustment disorder with mixed anxiety and depressed mood  Chronic narcotic use  Smoker - 1/2 PPD X 20 years.  Association of smoking with autoimmune disease especially rheumatoid arthritis was discussed.  Smoking cessation was discussed.  Orders: Orders Placed This Encounter  Procedures   XR KNEE 3 VIEW RIGHT   XR KNEE 3 VIEW LEFT   CBC with Differential/Platelet   Comprehensive metabolic panel with GFR   Sedimentation rate   CK   TSH   VITAMIN D  25 Hydroxy (Vit-D Deficiency, Fractures)   ANA   Anti-scleroderma antibody   RNP Antibody   Anti-Smith antibody   Sjogrens syndrome-A  extractable nuclear antibody   Sjogrens syndrome-B extractable nuclear antibody  Anti-DNA antibody, double-stranded   C3 and C4   Beta-2  glycoprotein antibodies   Cardiolipin antibodies, IgG, IgM, IgA   Serum protein electrophoresis with reflex   Thyroid  peroxidase antibody   Thyroglobulin antibody   No orders of the defined types were placed in this encounter.   Face-to-face time spent with patient was over 45 minutes. Greater than 50% of time was spent in counseling and coordination of care.  Follow-Up Instructions: Return for Pain in multiple joints and muscles.   Maya Nash, MD  Note - This record has been created using Animal nutritionist.  Chart creation errors have been sought, but may not always  have been located. Such creation errors do not reflect on  the standard of medical care.

## 2024-03-06 ENCOUNTER — Ambulatory Visit (INDEPENDENT_AMBULATORY_CARE_PROVIDER_SITE_OTHER)

## 2024-03-06 ENCOUNTER — Ambulatory Visit: Payer: No Typology Code available for payment source | Attending: Rheumatology | Admitting: Rheumatology

## 2024-03-06 ENCOUNTER — Ambulatory Visit

## 2024-03-06 ENCOUNTER — Encounter: Payer: Self-pay | Admitting: Rheumatology

## 2024-03-06 VITALS — BP 107/72 | HR 83 | Resp 14 | Ht 68.0 in | Wt 262.0 lb

## 2024-03-06 DIAGNOSIS — M2550 Pain in unspecified joint: Secondary | ICD-10-CM | POA: Diagnosis not present

## 2024-03-06 DIAGNOSIS — F1190 Opioid use, unspecified, uncomplicated: Secondary | ICD-10-CM

## 2024-03-06 DIAGNOSIS — G8929 Other chronic pain: Secondary | ICD-10-CM | POA: Diagnosis not present

## 2024-03-06 DIAGNOSIS — E559 Vitamin D deficiency, unspecified: Secondary | ICD-10-CM

## 2024-03-06 DIAGNOSIS — M5136 Other intervertebral disc degeneration, lumbar region with discogenic back pain only: Secondary | ICD-10-CM | POA: Diagnosis present

## 2024-03-06 DIAGNOSIS — M25562 Pain in left knee: Secondary | ICD-10-CM

## 2024-03-06 DIAGNOSIS — F4323 Adjustment disorder with mixed anxiety and depressed mood: Secondary | ICD-10-CM | POA: Diagnosis present

## 2024-03-06 DIAGNOSIS — R768 Other specified abnormal immunological findings in serum: Secondary | ICD-10-CM

## 2024-03-06 DIAGNOSIS — L309 Dermatitis, unspecified: Secondary | ICD-10-CM | POA: Diagnosis present

## 2024-03-06 DIAGNOSIS — M25551 Pain in right hip: Secondary | ICD-10-CM | POA: Diagnosis not present

## 2024-03-06 DIAGNOSIS — M25561 Pain in right knee: Secondary | ICD-10-CM

## 2024-03-06 DIAGNOSIS — N921 Excessive and frequent menstruation with irregular cycle: Secondary | ICD-10-CM

## 2024-03-06 DIAGNOSIS — M791 Myalgia, unspecified site: Secondary | ICD-10-CM

## 2024-03-06 DIAGNOSIS — F17200 Nicotine dependence, unspecified, uncomplicated: Secondary | ICD-10-CM | POA: Diagnosis present

## 2024-03-06 DIAGNOSIS — M25552 Pain in left hip: Secondary | ICD-10-CM

## 2024-03-06 DIAGNOSIS — F1193 Opioid use, unspecified with withdrawal: Secondary | ICD-10-CM

## 2024-03-06 DIAGNOSIS — R5383 Other fatigue: Secondary | ICD-10-CM

## 2024-03-06 DIAGNOSIS — D219 Benign neoplasm of connective and other soft tissue, unspecified: Secondary | ICD-10-CM

## 2024-03-07 ENCOUNTER — Ambulatory Visit: Payer: Self-pay | Admitting: Rheumatology

## 2024-03-07 DIAGNOSIS — R768 Other specified abnormal immunological findings in serum: Secondary | ICD-10-CM

## 2024-03-07 DIAGNOSIS — L309 Dermatitis, unspecified: Secondary | ICD-10-CM

## 2024-03-07 DIAGNOSIS — M2550 Pain in unspecified joint: Secondary | ICD-10-CM

## 2024-03-07 DIAGNOSIS — R5383 Other fatigue: Secondary | ICD-10-CM

## 2024-03-07 DIAGNOSIS — M791 Myalgia, unspecified site: Secondary | ICD-10-CM

## 2024-03-07 DIAGNOSIS — E559 Vitamin D deficiency, unspecified: Secondary | ICD-10-CM

## 2024-03-07 NOTE — Progress Notes (Signed)
 Vitamin D  is low.  Patient to take vitamin D  5000 units daily for 3 months and then repeat vitamin D  level.

## 2024-03-13 LAB — CBC WITH DIFFERENTIAL/PLATELET
Absolute Lymphocytes: 2297 {cells}/uL (ref 850–3900)
Absolute Monocytes: 626 {cells}/uL (ref 200–950)
Basophils Absolute: 52 {cells}/uL (ref 0–200)
Basophils Relative: 0.6 %
Eosinophils Absolute: 357 {cells}/uL (ref 15–500)
Eosinophils Relative: 4.1 %
HCT: 46.4 % — ABNORMAL HIGH (ref 35.0–45.0)
Hemoglobin: 15.5 g/dL (ref 11.7–15.5)
MCH: 31.7 pg (ref 27.0–33.0)
MCHC: 33.4 g/dL (ref 32.0–36.0)
MCV: 94.9 fL (ref 80.0–100.0)
MPV: 11.4 fL (ref 7.5–12.5)
Monocytes Relative: 7.2 %
Neutro Abs: 5368 {cells}/uL (ref 1500–7800)
Neutrophils Relative %: 61.7 %
Platelets: 249 Thousand/uL (ref 140–400)
RBC: 4.89 Million/uL (ref 3.80–5.10)
RDW: 12.6 % (ref 11.0–15.0)
Total Lymphocyte: 26.4 %
WBC: 8.7 Thousand/uL (ref 3.8–10.8)

## 2024-03-13 LAB — ANTI-SMITH ANTIBODY: ENA SM Ab Ser-aCnc: <1 AI

## 2024-03-13 LAB — BETA-2 GLYCOPROTEIN ANTIBODIES
Beta-2 Glyco 1 IgA: <2 U/mL (ref <20.0)
Beta-2 Glyco 1 IgM: <2 U/mL (ref <20.0)
Beta-2 Glyco I IgG: <2 U/mL (ref <20.0)

## 2024-03-13 LAB — CARDIOLIPIN ANTIBODIES, IGG, IGM, IGA
Anticardiolipin IgA: <2 [APL'U]/mL (ref <20.0)
Anticardiolipin IgG: <2 [GPL'U]/mL (ref <20.0)
Anticardiolipin IgM: <2 [MPL'U]/mL (ref <20.0)

## 2024-03-13 LAB — THYROID PEROXIDASE ANTIBODY: Thyroperoxidase Ab SerPl-aCnc: 1 [IU]/mL (ref <9)

## 2024-03-13 LAB — COMPREHENSIVE METABOLIC PANEL WITH GFR
AG Ratio: 1.6 (calc) (ref 1.0–2.5)
ALT: 17 U/L (ref 6–29)
AST: 21 U/L (ref 10–30)
Albumin: 4.5 g/dL (ref 3.6–5.1)
Alkaline phosphatase (APISO): 61 U/L (ref 31–125)
BUN: 8 mg/dL (ref 7–25)
CO2: 23 mmol/L (ref 20–32)
Calcium: 9.5 mg/dL (ref 8.6–10.2)
Chloride: 103 mmol/L (ref 98–110)
Creat: 0.82 mg/dL (ref 0.50–0.99)
Globulin: 2.9 g/dL (ref 1.9–3.7)
Glucose, Bld: 107 mg/dL (ref 65–139)
Potassium: 4.2 mmol/L (ref 3.5–5.3)
Sodium: 138 mmol/L (ref 135–146)
Total Bilirubin: 0.5 mg/dL (ref 0.2–1.2)
Total Protein: 7.4 g/dL (ref 6.1–8.1)
eGFR: 93 mL/min/1.73m2 (ref >=60)

## 2024-03-13 LAB — ANA: Anti Nuclear Antibody (ANA): POSITIVE — AB

## 2024-03-13 LAB — ANTI-DNA ANTIBODY, DOUBLE-STRANDED: ds DNA Ab: <1 [IU]/mL

## 2024-03-13 LAB — PROTEIN ELECTROPHORESIS, SERUM, WITH REFLEX
Albumin ELP: 4.1 g/dL (ref 3.8–4.8)
Alpha 1: 0.4 g/dL — ABNORMAL HIGH (ref 0.2–0.3)
Alpha 2: 0.9 g/dL (ref 0.5–0.9)
Beta 2: 0.5 g/dL (ref 0.2–0.5)
Beta Globulin: 0.5 g/dL (ref 0.4–0.6)
Gamma Globulin: 1 g/dL (ref 0.8–1.7)
Total Protein: 7.4 g/dL (ref 6.1–8.1)

## 2024-03-13 LAB — SJOGRENS SYNDROME-A EXTRACTABLE NUCLEAR ANTIBODY: SSA (Ro) (ENA) Antibody, IgG: <1 AI

## 2024-03-13 LAB — TSH: TSH: 1.11 m[IU]/L

## 2024-03-13 LAB — ANTI-SCLERODERMA ANTIBODY: Scleroderma (Scl-70) (ENA) Antibody, IgG: <1 AI

## 2024-03-13 LAB — IFE INTERPRETATION

## 2024-03-13 LAB — SEDIMENTATION RATE: Sed Rate: 34 mm/h — ABNORMAL HIGH (ref 0–20)

## 2024-03-13 LAB — C3 AND C4
C3 Complement: 193 mg/dL (ref 83–193)
C4 Complement: 41 mg/dL (ref 15–57)

## 2024-03-13 LAB — THYROGLOBULIN ANTIBODY: Thyroglobulin Ab: <1 [IU]/mL (ref <=1)

## 2024-03-13 LAB — ANTI-NUCLEAR AB-TITER (ANA TITER)
ANA TITER: 1:80 {titer} — ABNORMAL HIGH
ANA Titer 1: 1:1280 {titer} — ABNORMAL HIGH

## 2024-03-13 LAB — VITAMIN D 25 HYDROXY (VIT D DEFICIENCY, FRACTURES): Vit D, 25-Hydroxy: 21 ng/mL — ABNORMAL LOW (ref 30–100)

## 2024-03-13 LAB — RNP ANTIBODY: Ribonucleic Protein(ENA) Antibody, IgG: <1 AI

## 2024-03-13 LAB — SJOGRENS SYNDROME-B EXTRACTABLE NUCLEAR ANTIBODY: SSB (La) (ENA) Antibody, IgG: <1 AI

## 2024-03-13 LAB — CK: Total CK: 427 U/L — ABNORMAL HIGH (ref 20–239)

## 2024-03-14 NOTE — Progress Notes (Signed)
 CBC normal, CMP normal, sed rate mildly elevated at 34, CK elevated at 447, TSH normal, vitamin D  level is 21, ANA positive, SCL 70 negative, RNP negative, SSA negative, Smith negative, SSB negative, dsDNA negative, complements normal, beta-2  GP 1 negative, anticardiolipin negative, thyroglobulin negative, TPO negative, IFE normal.  Patient should have repeat CK in couple of weeks with aldolase.  If repeat CK is elevated we will add myositis panel.

## 2024-03-23 NOTE — Progress Notes (Deleted)
 Office Visit Note  Patient: Megan [name]             Date of Birth: 07-Nov-1985           MRN: [id number]             PCP: Sebastian Beverley NOVAK, MD Referring: Sebastian Beverley NOVAK, MD Visit Date: 04/06/2024 Occupation: @GUAROCC @  Subjective:  No chief complaint on file.   History of Present Illness: Megan [name] is a 38 y.o. female ***     Activities of Daily Living:  Patient reports morning stiffness for *** {minute/hour:19697}.   Patient {ACTIONS;DENIES/REPORTS:21021675::Denies} nocturnal pain.  Difficulty dressing/grooming: {ACTIONS;DENIES/REPORTS:21021675::Denies} Difficulty climbing stairs: {ACTIONS;DENIES/REPORTS:21021675::Denies} Difficulty getting out of chair: {ACTIONS;DENIES/REPORTS:21021675::Denies} Difficulty using hands for taps, buttons, cutlery, and/or writing: {ACTIONS;DENIES/REPORTS:21021675::Denies}  No Rheumatology ROS completed.   PMFS History:  Patient Active Problem List   Diagnosis Date Noted   Opioid withdrawal (HCC) 09/30/2023   Degeneration of intervertebral disc of lumbar region with discogenic back pain 09/30/2023   Positive ANA (antinuclear antibody) 09/30/2023   Chronic pelvic pain syndrome in female 09/30/2023   Vaginal discharge 05/14/2022   Eczema 04/13/2022   Adjustment disorder with mixed anxiety and depressed mood 04/13/2022   Other physical and mental strain related to work 03/11/2022   BMI 40.0-44.9, adult (HCC) 03/09/2022   Fibroid 01/19/2022   Fibroid tumor 01/04/2022   Menorrhagia with irregular cycle 01/01/2022    Past Medical History:  Diagnosis Date   Allergy    Have had sense 29years of age or younger   Anxiety    Arthritis    Depression    Drug dependence (HCC)    GERD (gastroesophageal reflux disease)    Lupus (systemic lupus erythematosus) (HCC)    Neuromuscular disorder (HCC) 6years ago   Diagnosis is in myChart   Tooth infection 01/28/2022    Family History  Problem Relation Age of Onset    Arthritis Mother    Heart disease Mother    Kidney disease Mother    Miscarriages / [place] Mother    Early death Sister    Diabetes Maternal Grandfather    Bone cancer Maternal Aunt    Varicose Veins Maternal Aunt    Colon cancer Neg Hx    Breast cancer Neg Hx    Ovarian cancer Neg Hx    Endometrial cancer Neg Hx    Pancreatic cancer Neg Hx    Prostate cancer Neg Hx    Past Surgical History:  Procedure Laterality Date   ABDOMINAL HYSTERECTOMY  10/21/2022   ENDOMETRIAL BIOPSY  03/09/2022   IR 3D INDEPENDENT WKST  10/20/2022   IR ANGIOGRAM PELVIS SELECTIVE OR SUPRASELECTIVE  10/20/2022   IR ANGIOGRAM SELECTIVE EACH ADDITIONAL VESSEL  10/20/2022   IR ANGIOGRAM SELECTIVE EACH ADDITIONAL VESSEL  10/20/2022   IR ANGIOGRAM SELECTIVE EACH ADDITIONAL VESSEL  10/20/2022   IR EMBO TUMOR ORGAN ISCHEMIA INFARCT INC GUIDE ROADMAPPING  10/20/2022   IR RADIOLOGIST EVAL & MGMT  10/06/2022   IR US  GUIDE VASC ACCESS LEFT  10/20/2022   LIPOMA EXCISION  2009   on forehead   LIPOMA EXCISION  04/2023   MOUTH SURGERY  07/2023   ROBOTIC ASSISTED TOTAL HYSTERECTOMY WITH BILATERAL SALPINGO OOPHERECTOMY Bilateral 10/21/2022   Procedure: DIAGNOSTIC LAPAROSCOPY, XI ROBOTIC ASSISTED HYSTERECTOMY WITH BILATERAL SALPINGECTOMY, EXPLORATORY LAPAROTOMY FOR SPECIMEN DELIVERY, CYSTOSCOPY;  Surgeon: Viktoria Comer SAUNDERS, MD;  Location: [facility];  Service: Gynecology;  Laterality: Bilateral;   Social History   Social History Narrative   Not on  file   Immunization History  Administered Date(s) Administered   PFIZER Comirnaty(Gray Top)Covid-19 Tri-Sucrose Vaccine 11/14/2020     Objective: Vital Signs: LMP  (LMP Unknown)    Physical Exam   Musculoskeletal Exam: ***  CDAI Exam: CDAI Score: -- Patient Global: --; Provider Global: -- Swollen: --; Tender: -- Joint Exam 04/06/2024   No joint exam has been documented for this visit   There is currently no information documented on the homunculus. Go to  the Rheumatology activity and complete the homunculus joint exam.  Investigation: No additional findings.  Imaging: XR KNEE 3 VIEW RIGHT Result Date: 03/06/2024 No medial or lateral compartment narrowing was noted.  No patellofemoral narrowing was noted.  No chondrocalcinosis was noted. Impression: Unremarkable x-rays of the knee.  XR KNEE 3 VIEW LEFT Result Date: 03/06/2024 No medial or lateral compartment narrowing was noted.  No patellofemoral narrowing was noted.  No chondrocalcinosis was noted. Impression: Unremarkable x-rays of the knee joint.   Recent Labs: Lab Results  Component Value Date   WBC 8.7 03/06/2024   HGB 15.5 03/06/2024   PLT 249 03/06/2024   NA 138 03/06/2024   K 4.2 03/06/2024   CL 103 03/06/2024   CO2 23 03/06/2024   GLUCOSE 107 03/06/2024   BUN 8 03/06/2024   CREATININE 0.82 03/06/2024   BILITOT 0.5 03/06/2024   ALKPHOS 85 10/08/2022   AST 21 03/06/2024   ALT 17 03/06/2024   PROT 7.4 03/06/2024   PROT 7.4 03/06/2024   ALBUMIN 3.2 (L) 10/08/2022   CALCIUM 9.5 03/06/2024   GFRAA 82 (L) 02/26/2012   March 06, 2024 ANA 1: 1280 nucleolar, nucleolar, 1: 80, NS, ENA (SCL 70, RNP, Smith, SSA, SSB, dsDNA) negative, anticardiolipin negative, beta-2  GP 1 negative, C3-C4 normal, TPO negative, thyroglobulin negative, IFE normal, sed rate 34, CK4 27, TSH 1.11, vitamin D  21  Speciality Comments: No specialty comments available.  Procedures:  No procedures performed Allergies: Penicillins, Tramadol , and Penicillin g   Assessment / Plan:     Visit Diagnoses: No diagnosis found.  Orders: No orders of the defined types were placed in this encounter.  No orders of the defined types were placed in this encounter.   Face-to-face time spent with patient was *** minutes. Greater than 50% of time was spent in counseling and coordination of care.  Follow-Up Instructions: No follow-ups on file.   Maya Nash, MD  Note - This record has been created using  Animal nutritionist.  Chart creation errors have been sought, but may not always  have been located. Such creation errors do not reflect on  the standard of medical care.

## 2024-03-24 ENCOUNTER — Ambulatory Visit: Payer: Self-pay

## 2024-03-24 NOTE — Telephone Encounter (Signed)
 FYI Only or Action Required?: Action required by provider: states started Neurontin  two weeks ago and developed rash after working outside.  Patient was last seen in primary care on 09/30/2023 by Sebastian Beverley NOVAK, MD.  Called Nurse Triage reporting Rash.  Symptoms began a week ago.  Interventions attempted: OTC medications: benadryl .  Symptoms are: unchanged.  Triage Disposition: See Physician Within 24 Hours  Patient/caregiver understands and will follow disposition?:   Instructed to go to UC; apt made for Monday per  patient request    Copied from CRM #[id number]. Topic: Clinical - Red Word Triage >> Mar 24, 2024  1:11 PM [place] J wrote: [facility] that prompted transfer to Nurse Triage: Patient was doing yard work last week and now has a worsening rash that is spreading to her arms, legs, and stomach. She stated that it's itchy, swollen, and sore. ----------------------------------------------------------------------- From previous Reason for Contact - Scheduling: Patient/patient representative is calling to schedule an appointment. Refer to attachments for appointment information. Reason for Disposition  SEVERE itching (i.e., interferes with sleep, normal activities or school)  Answer Assessment - Initial Assessment Questions 1. APPEARANCE of RASH: What does the rash look like? (e.g., blisters, dry flaky skin, red spots, redness, sores)     Little patches of bumps to hand, arms, legs, and stomach, itchy, swollen and sore 2. SIZE: How big are the spots? (e.g., tip of pen, eraser, coin; inches, centimeters)     tiny 3. LOCATION: Where is the rash located?     See above 4. COLOR: What color is the rash? (Note: It is difficult to assess rash color in people with darker-colored skin. When this situation occurs, simply ask the caller to describe what they see.)     Red and heat to it 5. ONSET: When did the rash begin?     Last week 6. FEVER: Do you have a fever? If Yes,  ask: What is your temperature, how was it measured, and when did it start?     no 7. ITCHING: Does the rash itch? If Yes, ask: How bad is the itch? (Scale 1-10; or mild, moderate, severe)     severe 8. CAUSE: What do you think is causing the rash?     unknown 9. MEDICINE FACTORS: Have you started any new medicines within the last 2 weeks? (e.g., antibiotics)      gabapentin  10. OTHER SYMPTOMS: Do you have any other symptoms? (e.g., dizziness, headache, sore throat, joint pain)       headache 11. PREGNANCY: Is there any chance you are pregnant? When was your last menstrual period?       na  Protocols used: Rash or Redness - [facility]

## 2024-03-27 ENCOUNTER — Ambulatory Visit: Admitting: Internal Medicine

## 2024-03-28 ENCOUNTER — Ambulatory Visit: Admitting: Family Medicine

## 2024-03-30 ENCOUNTER — Ambulatory Visit: Admitting: Nurse Practitioner

## 2024-04-03 ENCOUNTER — Telehealth: Payer: Self-pay | Admitting: Family Medicine

## 2024-04-03 NOTE — Telephone Encounter (Signed)
 09/28/2023 late arrival 03/30/2024 same day cancellation  Final warning sent via mail and mychart

## 2024-04-06 ENCOUNTER — Ambulatory Visit: Payer: No Typology Code available for payment source | Admitting: Rheumatology

## 2024-04-06 DIAGNOSIS — L309 Dermatitis, unspecified: Secondary | ICD-10-CM

## 2024-04-06 DIAGNOSIS — E559 Vitamin D deficiency, unspecified: Secondary | ICD-10-CM

## 2024-04-06 DIAGNOSIS — M791 Myalgia, unspecified site: Secondary | ICD-10-CM

## 2024-04-06 DIAGNOSIS — F4323 Adjustment disorder with mixed anxiety and depressed mood: Secondary | ICD-10-CM

## 2024-04-06 DIAGNOSIS — F1190 Opioid use, unspecified, uncomplicated: Secondary | ICD-10-CM

## 2024-04-06 DIAGNOSIS — M5136 Other intervertebral disc degeneration, lumbar region with discogenic back pain only: Secondary | ICD-10-CM

## 2024-04-06 DIAGNOSIS — F17200 Nicotine dependence, unspecified, uncomplicated: Secondary | ICD-10-CM

## 2024-04-06 DIAGNOSIS — G8929 Other chronic pain: Secondary | ICD-10-CM

## 2024-04-06 DIAGNOSIS — M2550 Pain in unspecified joint: Secondary | ICD-10-CM

## 2024-04-06 DIAGNOSIS — R768 Other specified abnormal immunological findings in serum: Secondary | ICD-10-CM

## 2024-04-06 NOTE — Progress Notes (Deleted)
 Office Visit Note  Patient: Megan [name]             Date of Birth: Jan 04, 1986           MRN: [id number]             PCP: Sebastian Beverley NOVAK, MD Referring: Sebastian Beverley NOVAK, MD Visit Date: 04/17/2024 Occupation: @GUAROCC @  Subjective:  No chief complaint on file.   History of Present Illness: Megan [name] is a 38 y.o. female ***     Activities of Daily Living:  Patient reports morning stiffness for *** {minute/hour:19697}.   Patient {ACTIONS;DENIES/REPORTS:21021675::Denies} nocturnal pain.  Difficulty dressing/grooming: {ACTIONS;DENIES/REPORTS:21021675::Denies} Difficulty climbing stairs: {ACTIONS;DENIES/REPORTS:21021675::Denies} Difficulty getting out of chair: {ACTIONS;DENIES/REPORTS:21021675::Denies} Difficulty using hands for taps, buttons, cutlery, and/or writing: {ACTIONS;DENIES/REPORTS:21021675::Denies}  No Rheumatology ROS completed.   PMFS History:  Patient Active Problem List   Diagnosis Date Noted  . Opioid withdrawal (HCC) 09/30/2023  . Degeneration of intervertebral disc of lumbar region with discogenic back pain 09/30/2023  . Positive ANA (antinuclear antibody) 09/30/2023  . Chronic pelvic pain syndrome in female 09/30/2023  . Vaginal discharge 05/14/2022  . Eczema 04/13/2022  . Adjustment disorder with mixed anxiety and depressed mood 04/13/2022  . Other physical and mental strain related to work 03/11/2022  . BMI 40.0-44.9, adult (HCC) 03/09/2022  . Fibroid 01/19/2022  . Fibroid tumor 01/04/2022  . Menorrhagia with irregular cycle 01/01/2022    Past Medical History:  Diagnosis Date  . Allergy    Have had sense 23years of age or younger  . Anxiety   . Arthritis   . Depression   . Drug dependence (HCC)   . GERD (gastroesophageal reflux disease)   . Lupus (systemic lupus erythematosus) (HCC)   . Neuromuscular disorder (HCC) 6years ago   Diagnosis is in myChart  . Tooth infection 01/28/2022    Family History  Problem Relation  Age of Onset  . Arthritis Mother   . Heart disease Mother   . Kidney disease Mother   . Miscarriages / [place] Mother   . Early death Sister   . Diabetes Maternal Grandfather   . Bone cancer Maternal Aunt   . Varicose Veins Maternal Aunt   . Colon cancer Neg Hx   . Breast cancer Neg Hx   . Ovarian cancer Neg Hx   . Endometrial cancer Neg Hx   . Pancreatic cancer Neg Hx   . Prostate cancer Neg Hx    Past Surgical History:  Procedure Laterality Date  . ABDOMINAL HYSTERECTOMY  10/21/2022  . ENDOMETRIAL BIOPSY  03/09/2022  . IR 3D INDEPENDENT WKST  10/20/2022  . IR ANGIOGRAM PELVIS SELECTIVE OR SUPRASELECTIVE  10/20/2022  . IR ANGIOGRAM SELECTIVE EACH ADDITIONAL VESSEL  10/20/2022  . IR ANGIOGRAM SELECTIVE EACH ADDITIONAL VESSEL  10/20/2022  . IR ANGIOGRAM SELECTIVE EACH ADDITIONAL VESSEL  10/20/2022  . IR EMBO TUMOR ORGAN ISCHEMIA INFARCT INC GUIDE ROADMAPPING  10/20/2022  . IR RADIOLOGIST EVAL & MGMT  10/06/2022  . IR US  GUIDE VASC ACCESS LEFT  10/20/2022  . LIPOMA EXCISION  2009   on forehead  . LIPOMA EXCISION  04/2023  . MOUTH SURGERY  07/2023  . ROBOTIC ASSISTED TOTAL HYSTERECTOMY WITH BILATERAL SALPINGO OOPHERECTOMY Bilateral 10/21/2022   Procedure: DIAGNOSTIC LAPAROSCOPY, XI ROBOTIC ASSISTED HYSTERECTOMY WITH BILATERAL SALPINGECTOMY, EXPLORATORY LAPAROTOMY FOR SPECIMEN DELIVERY, CYSTOSCOPY;  Surgeon: Viktoria Comer SAUNDERS, MD;  Location: [facility];  Service: Gynecology;  Laterality: Bilateral;   Social History   Social History Narrative  . Not on  file   Immunization History  Administered Date(s) Administered  . PFIZER Comirnaty(Gray Top)Covid-19 Tri-Sucrose Vaccine 11/14/2020     Objective: Vital Signs: LMP  (LMP Unknown)    Physical Exam   Musculoskeletal Exam: ***  CDAI Exam: CDAI Score: -- Patient Global: --; Provider Global: -- Swollen: --; Tender: -- Joint Exam 04/17/2024   No joint exam has been documented for this visit   There is currently no  information documented on the homunculus. Go to the Rheumatology activity and complete the homunculus joint exam.  Investigation: No additional findings.  Imaging: No results found.  Recent Labs: Lab Results  Component Value Date   WBC 8.7 03/06/2024   HGB 15.5 03/06/2024   PLT 249 03/06/2024   NA 138 03/06/2024   K 4.2 03/06/2024   CL 103 03/06/2024   CO2 23 03/06/2024   GLUCOSE 107 03/06/2024   BUN 8 03/06/2024   CREATININE 0.82 03/06/2024   BILITOT 0.5 03/06/2024   ALKPHOS 85 10/08/2022   AST 21 03/06/2024   ALT 17 03/06/2024   PROT 7.4 03/06/2024   PROT 7.4 03/06/2024   ALBUMIN 3.2 (L) 10/08/2022   CALCIUM 9.5 03/06/2024   GFRAA 82 (L) 02/26/2012   March 06, 2024 ANA 1: 1280 nuclear, nucleolar, 1: 80 NS, ENA (SCL 70, RNP, Smith, SSA, SSB, dsDNA) negative, beta-2  GP 1 negative, anticardiolipin negative, C3-C4 normal, sed rate 34, CK427, TSH normal, thyroglobulin antibody negative, TPO antibody negative, IFE normal, vitamin D  21 Speciality Comments: No specialty comments available.  Procedures:  No procedures performed Allergies: Penicillins, Tramadol , and Penicillin g   Assessment / Plan:     Visit Diagnoses: No diagnosis found.  Orders: No orders of the defined types were placed in this encounter.  No orders of the defined types were placed in this encounter.   Face-to-face time spent with patient was *** minutes. Greater than 50% of time was spent in counseling and coordination of care.  Follow-Up Instructions: No follow-ups on file.   Maya Nash, MD  Note - This record has been created using Animal nutritionist.  Chart creation errors have been sought, but may not always  have been located. Such creation errors do not reflect on  the standard of medical care.

## 2024-04-17 ENCOUNTER — Ambulatory Visit: Admitting: Rheumatology

## 2024-04-17 DIAGNOSIS — R748 Abnormal levels of other serum enzymes: Secondary | ICD-10-CM

## 2024-04-17 DIAGNOSIS — F1190 Opioid use, unspecified, uncomplicated: Secondary | ICD-10-CM

## 2024-04-17 DIAGNOSIS — M797 Fibromyalgia: Secondary | ICD-10-CM

## 2024-04-17 DIAGNOSIS — F17200 Nicotine dependence, unspecified, uncomplicated: Secondary | ICD-10-CM

## 2024-04-17 DIAGNOSIS — F4323 Adjustment disorder with mixed anxiety and depressed mood: Secondary | ICD-10-CM

## 2024-04-17 DIAGNOSIS — G8929 Other chronic pain: Secondary | ICD-10-CM

## 2024-04-17 DIAGNOSIS — M2550 Pain in unspecified joint: Secondary | ICD-10-CM

## 2024-04-17 DIAGNOSIS — L309 Dermatitis, unspecified: Secondary | ICD-10-CM

## 2024-04-17 DIAGNOSIS — R768 Other specified abnormal immunological findings in serum: Secondary | ICD-10-CM

## 2024-04-17 DIAGNOSIS — R5383 Other fatigue: Secondary | ICD-10-CM

## 2024-04-17 DIAGNOSIS — M5136 Other intervertebral disc degeneration, lumbar region with discogenic back pain only: Secondary | ICD-10-CM
